# Patient Record
Sex: Male | Born: 1955 | Race: White | Hispanic: No | Marital: Single | Smoking: Former smoker
Health system: Southern US, Community
[De-identification: ages and names within clinical notes are randomized; demographics above are authoritative.]

## PROBLEM LIST (undated history)

## (undated) DIAGNOSIS — E7800 Pure hypercholesterolemia, unspecified: Secondary | ICD-10-CM

## (undated) DIAGNOSIS — E559 Vitamin D deficiency, unspecified: Secondary | ICD-10-CM

## (undated) DIAGNOSIS — D649 Anemia, unspecified: Secondary | ICD-10-CM

## (undated) DIAGNOSIS — F329 Major depressive disorder, single episode, unspecified: Secondary | ICD-10-CM

## (undated) DIAGNOSIS — I10 Essential (primary) hypertension: Secondary | ICD-10-CM

## (undated) DIAGNOSIS — F0390 Unspecified dementia without behavioral disturbance: Secondary | ICD-10-CM

## (undated) DIAGNOSIS — IMO0002 Reserved for concepts with insufficient information to code with codable children: Secondary | ICD-10-CM

## (undated) DIAGNOSIS — K219 Gastro-esophageal reflux disease without esophagitis: Secondary | ICD-10-CM

## (undated) DIAGNOSIS — N189 Chronic kidney disease, unspecified: Secondary | ICD-10-CM

## (undated) DIAGNOSIS — F29 Unspecified psychosis not due to a substance or known physiological condition: Secondary | ICD-10-CM

## (undated) DIAGNOSIS — I2510 Atherosclerotic heart disease of native coronary artery without angina pectoris: Secondary | ICD-10-CM

## (undated) DIAGNOSIS — M1990 Unspecified osteoarthritis, unspecified site: Secondary | ICD-10-CM

## (undated) DIAGNOSIS — Z8739 Personal history of other diseases of the musculoskeletal system and connective tissue: Secondary | ICD-10-CM

## (undated) HISTORY — PX: OTHER SURGICAL HISTORY: SHX169

---

## 2001-11-22 HISTORY — PX: EYE SURGERY: SHX253

## 2010-10-26 ENCOUNTER — Emergency Department (HOSPITAL_COMMUNITY)
Admission: EM | Admit: 2010-10-26 | Discharge: 2010-10-26 | Payer: Self-pay | Source: Home / Self Care | Admitting: Emergency Medicine

## 2010-11-20 ENCOUNTER — Emergency Department (HOSPITAL_COMMUNITY)
Admission: EM | Admit: 2010-11-20 | Discharge: 2010-11-20 | Payer: Self-pay | Source: Home / Self Care | Admitting: Emergency Medicine

## 2010-12-19 ENCOUNTER — Emergency Department (HOSPITAL_COMMUNITY)
Admission: EM | Admit: 2010-12-19 | Discharge: 2010-12-21 | Disposition: A | Discharge status: Other Acute Inpatient Hospital | Payer: Self-pay | Source: Home / Self Care | Admitting: Emergency Medicine

## 2010-12-19 LAB — RAPID URINE DRUG SCREEN, HOSP PERFORMED
Barbiturates: NOT DETECTED
Opiates: POSITIVE — AB
Tetrahydrocannabinol: NOT DETECTED

## 2010-12-19 LAB — CBC
HCT: 42.3 % (ref 39.0–52.0)
Hemoglobin: 13.7 g/dL (ref 13.0–17.0)
MCHC: 32.4 g/dL (ref 30.0–36.0)
MCV: 91.8 fL (ref 78.0–100.0)
Platelets: 172 10*3/uL (ref 150–400)
WBC: 10.8 10*3/uL — ABNORMAL HIGH (ref 4.0–10.5)

## 2010-12-19 LAB — CK TOTAL AND CKMB (NOT AT ARMC)
CK, MB: 1.2 ng/mL (ref 0.3–4.0)
Total CK: 65 U/L (ref 7–232)

## 2010-12-19 LAB — DIFFERENTIAL
Basophils Absolute: 0 10*3/uL (ref 0.0–0.1)
Lymphs Abs: 1.4 10*3/uL (ref 0.7–4.0)
Monocytes Relative: 7 % (ref 3–12)

## 2010-12-19 LAB — COMPREHENSIVE METABOLIC PANEL
ALT: 14 U/L (ref 0–53)
Albumin: 3.9 g/dL (ref 3.5–5.2)
Alkaline Phosphatase: 61 U/L (ref 39–117)
CO2: 25 mEq/L (ref 19–32)
Chloride: 106 mEq/L (ref 96–112)
GFR calc non Af Amer: >60 mL/min (ref >60)
Sodium: 142 mEq/L (ref 135–145)
Total Protein: 7.1 g/dL (ref 6.0–8.3)

## 2010-12-20 DIAGNOSIS — F29 Unspecified psychosis not due to a substance or known physiological condition: Secondary | ICD-10-CM

## 2010-12-20 LAB — GLUCOSE, CAPILLARY
Glucose-Capillary: 109 mg/dL — ABNORMAL HIGH (ref 70–99)
Glucose-Capillary: 67 mg/dL — ABNORMAL LOW (ref 70–99)

## 2010-12-21 ENCOUNTER — Inpatient Hospital Stay (HOSPITAL_COMMUNITY)
Admission: AD | Admit: 2010-12-21 | Discharge: 2010-12-29 | DRG: 885 | Disposition: A | Discharge status: Home / Self Care | Payer: Medicaid Other | Attending: Psychiatry | Admitting: Psychiatry

## 2010-12-21 DIAGNOSIS — I10 Essential (primary) hypertension: Secondary | ICD-10-CM

## 2010-12-21 DIAGNOSIS — E785 Hyperlipidemia, unspecified: Secondary | ICD-10-CM

## 2010-12-21 DIAGNOSIS — K219 Gastro-esophageal reflux disease without esophagitis: Secondary | ICD-10-CM

## 2010-12-21 DIAGNOSIS — F411 Generalized anxiety disorder: Secondary | ICD-10-CM

## 2010-12-21 DIAGNOSIS — D649 Anemia, unspecified: Secondary | ICD-10-CM

## 2010-12-21 DIAGNOSIS — F29 Unspecified psychosis not due to a substance or known physiological condition: Secondary | ICD-10-CM

## 2010-12-21 DIAGNOSIS — Z7982 Long term (current) use of aspirin: Secondary | ICD-10-CM

## 2010-12-21 DIAGNOSIS — E119 Type 2 diabetes mellitus without complications: Secondary | ICD-10-CM

## 2010-12-21 DIAGNOSIS — I2510 Atherosclerotic heart disease of native coronary artery without angina pectoris: Secondary | ICD-10-CM

## 2010-12-21 DIAGNOSIS — E669 Obesity, unspecified: Secondary | ICD-10-CM

## 2010-12-21 DIAGNOSIS — F39 Unspecified mood [affective] disorder: Principal | ICD-10-CM

## 2010-12-21 DIAGNOSIS — R45851 Suicidal ideations: Secondary | ICD-10-CM

## 2010-12-21 LAB — GLUCOSE, CAPILLARY
Glucose-Capillary: 104 mg/dL — ABNORMAL HIGH (ref 70–99)
Glucose-Capillary: 118 mg/dL — ABNORMAL HIGH (ref 70–99)
Glucose-Capillary: 67 mg/dL — ABNORMAL LOW (ref 70–99)
Glucose-Capillary: 82 mg/dL (ref 70–99)

## 2010-12-21 LAB — CK TOTAL AND CKMB (NOT AT ARMC)
CK, MB: 0.6 ng/mL (ref 0.3–4.0)
Relative Index: INVALID (ref 0.0–2.5)

## 2010-12-22 LAB — GLUCOSE, CAPILLARY: Glucose-Capillary: 79 mg/dL (ref 70–99)

## 2010-12-23 DIAGNOSIS — F333 Major depressive disorder, recurrent, severe with psychotic symptoms: Secondary | ICD-10-CM

## 2010-12-23 LAB — GLUCOSE, CAPILLARY
Glucose-Capillary: 93 mg/dL (ref 70–99)
Glucose-Capillary: 100 mg/dL — ABNORMAL HIGH (ref 70–99)

## 2010-12-24 LAB — GLUCOSE, CAPILLARY

## 2010-12-25 LAB — GLUCOSE, CAPILLARY: Glucose-Capillary: 128 mg/dL — ABNORMAL HIGH (ref 70–99)

## 2010-12-26 LAB — GLUCOSE, CAPILLARY: Glucose-Capillary: 84 mg/dL (ref 70–99)

## 2010-12-27 LAB — GLUCOSE, CAPILLARY

## 2010-12-28 LAB — GLUCOSE, CAPILLARY
Glucose-Capillary: 106 mg/dL — ABNORMAL HIGH (ref 70–99)
Glucose-Capillary: 154 mg/dL — ABNORMAL HIGH (ref 70–99)
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
## 2011-01-15 ENCOUNTER — Emergency Department (HOSPITAL_COMMUNITY)
Admission: EM | Admit: 2011-01-15 | Discharge: 2011-01-15 | Disposition: A | Discharge status: Home / Self Care | Payer: Medicaid Other | Attending: Emergency Medicine | Admitting: Emergency Medicine

## 2011-01-15 DIAGNOSIS — I2510 Atherosclerotic heart disease of native coronary artery without angina pectoris: Secondary | ICD-10-CM | POA: Insufficient documentation

## 2011-01-15 DIAGNOSIS — R220 Localized swelling, mass and lump, head: Secondary | ICD-10-CM | POA: Insufficient documentation

## 2011-01-15 DIAGNOSIS — L0202 Furuncle of face: Secondary | ICD-10-CM | POA: Insufficient documentation

## 2011-01-15 DIAGNOSIS — L0201 Cutaneous abscess of face: Secondary | ICD-10-CM | POA: Insufficient documentation

## 2011-01-15 DIAGNOSIS — E7800 Pure hypercholesterolemia, unspecified: Secondary | ICD-10-CM | POA: Insufficient documentation

## 2011-01-15 DIAGNOSIS — F329 Major depressive disorder, single episode, unspecified: Secondary | ICD-10-CM | POA: Insufficient documentation

## 2011-01-15 DIAGNOSIS — R51 Headache: Secondary | ICD-10-CM | POA: Insufficient documentation

## 2011-01-15 DIAGNOSIS — Z79899 Other long term (current) drug therapy: Secondary | ICD-10-CM | POA: Insufficient documentation

## 2011-01-15 DIAGNOSIS — E119 Type 2 diabetes mellitus without complications: Secondary | ICD-10-CM | POA: Insufficient documentation

## 2011-01-15 DIAGNOSIS — F3289 Other specified depressive episodes: Secondary | ICD-10-CM | POA: Insufficient documentation

## 2011-01-15 DIAGNOSIS — I10 Essential (primary) hypertension: Secondary | ICD-10-CM | POA: Insufficient documentation

## 2011-01-15 DIAGNOSIS — L03211 Cellulitis of face: Secondary | ICD-10-CM | POA: Insufficient documentation

## 2011-01-20 ENCOUNTER — Emergency Department (HOSPITAL_COMMUNITY): Payer: Medicaid Other

## 2011-01-20 ENCOUNTER — Emergency Department (HOSPITAL_COMMUNITY)
Admission: EM | Admit: 2011-01-20 | Discharge: 2011-01-21 | Disposition: A | Discharge status: Home / Self Care | Payer: Medicaid Other | Attending: Emergency Medicine | Admitting: Emergency Medicine

## 2011-01-20 DIAGNOSIS — IMO0002 Reserved for concepts with insufficient information to code with codable children: Secondary | ICD-10-CM | POA: Insufficient documentation

## 2011-01-20 DIAGNOSIS — E7800 Pure hypercholesterolemia, unspecified: Secondary | ICD-10-CM | POA: Insufficient documentation

## 2011-01-20 DIAGNOSIS — F329 Major depressive disorder, single episode, unspecified: Secondary | ICD-10-CM | POA: Insufficient documentation

## 2011-01-20 DIAGNOSIS — I2510 Atherosclerotic heart disease of native coronary artery without angina pectoris: Secondary | ICD-10-CM | POA: Insufficient documentation

## 2011-01-20 DIAGNOSIS — M542 Cervicalgia: Secondary | ICD-10-CM | POA: Insufficient documentation

## 2011-01-20 DIAGNOSIS — I10 Essential (primary) hypertension: Secondary | ICD-10-CM | POA: Insufficient documentation

## 2011-01-20 DIAGNOSIS — F3289 Other specified depressive episodes: Secondary | ICD-10-CM | POA: Insufficient documentation

## 2011-01-20 DIAGNOSIS — E119 Type 2 diabetes mellitus without complications: Secondary | ICD-10-CM | POA: Insufficient documentation

## 2011-01-20 DIAGNOSIS — Z79899 Other long term (current) drug therapy: Secondary | ICD-10-CM | POA: Insufficient documentation

## 2011-01-20 DIAGNOSIS — Z7982 Long term (current) use of aspirin: Secondary | ICD-10-CM | POA: Insufficient documentation

## 2011-01-20 DIAGNOSIS — S060X9A Concussion with loss of consciousness of unspecified duration, initial encounter: Secondary | ICD-10-CM | POA: Insufficient documentation

## 2011-01-20 DIAGNOSIS — W108XXA Fall (on) (from) other stairs and steps, initial encounter: Secondary | ICD-10-CM | POA: Insufficient documentation

## 2011-01-20 DIAGNOSIS — F17200 Nicotine dependence, unspecified, uncomplicated: Secondary | ICD-10-CM | POA: Insufficient documentation

## 2011-01-20 DIAGNOSIS — S335XXA Sprain of ligaments of lumbar spine, initial encounter: Secondary | ICD-10-CM | POA: Insufficient documentation

## 2011-01-20 DIAGNOSIS — R55 Syncope and collapse: Secondary | ICD-10-CM | POA: Insufficient documentation

## 2011-01-20 LAB — POCT CARDIAC MARKERS
CKMB, poc: <1 ng/mL — ABNORMAL LOW (ref 1.0–8.0)
Myoglobin, poc: 56.9 ng/mL (ref 12–200)
Troponin i, poc: <0.05 ng/mL (ref 0.00–0.09)

## 2011-01-20 LAB — DIFFERENTIAL
Eosinophils Relative: 1 % (ref 0–5)
Lymphocytes Relative: 19 % (ref 12–46)
Lymphs Abs: 2.2 10*3/uL (ref 0.7–4.0)
Monocytes Absolute: 0.9 10*3/uL (ref 0.1–1.0)
Monocytes Relative: 8 % (ref 3–12)

## 2011-01-20 LAB — CBC
HCT: 39.2 % (ref 39.0–52.0)
Hemoglobin: 12.7 g/dL — ABNORMAL LOW (ref 13.0–17.0)
MCHC: 32.4 g/dL (ref 30.0–36.0)
MCV: 92.5 fL (ref 78.0–100.0)
RDW: 13.4 % (ref 11.5–15.5)

## 2011-01-20 LAB — BASIC METABOLIC PANEL
BUN: 16 mg/dL (ref 6–23)
Calcium: 8.9 mg/dL (ref 8.4–10.5)
GFR calc non Af Amer: >60 mL/min (ref >60)
Potassium: 3.7 mEq/L (ref 3.5–5.1)
Sodium: 139 mEq/L (ref 135–145)

## 2011-02-01 LAB — DIFFERENTIAL
Eosinophils Relative: 1 % (ref 0–5)
Lymphocytes Relative: 20 % (ref 12–46)
Lymphs Abs: 1.5 10*3/uL (ref 0.7–4.0)
Monocytes Absolute: 0.6 10*3/uL (ref 0.1–1.0)
Monocytes Relative: 8 % (ref 3–12)

## 2011-02-01 LAB — POCT I-STAT, CHEM 8
BUN: 12 mg/dL (ref 6–23)
Calcium, Ion: 1.07 mmol/L — ABNORMAL LOW (ref 1.12–1.32)
Creatinine, Ser: 0.8 mg/dL (ref 0.4–1.5)
Glucose, Bld: 106 mg/dL — ABNORMAL HIGH (ref 70–99)
TCO2: 26 mmol/L (ref 0–100)

## 2011-02-01 LAB — CBC
HCT: 39 % (ref 39.0–52.0)
MCV: 93.5 fL (ref 78.0–100.0)
RBC: 4.17 MIL/uL — ABNORMAL LOW (ref 4.22–5.81)
WBC: 7.4 10*3/uL (ref 4.0–10.5)

## 2011-02-02 LAB — CBC
Hemoglobin: 14.1 g/dL (ref 13.0–17.0)
MCH: 31.2 pg (ref 26.0–34.0)
MCV: 92.3 fL (ref 78.0–100.0)
RBC: 4.52 MIL/uL (ref 4.22–5.81)

## 2011-02-02 LAB — GLUCOSE, CAPILLARY

## 2011-02-02 LAB — DIFFERENTIAL
Eosinophils Absolute: 0.1 10*3/uL (ref 0.0–0.7)
Lymphs Abs: 2.2 10*3/uL (ref 0.7–4.0)
Monocytes Relative: 7 % (ref 3–12)
Neutrophils Relative %: 74 % (ref 43–77)

## 2011-02-08 ENCOUNTER — Emergency Department (HOSPITAL_COMMUNITY)
Admission: EM | Admit: 2011-02-08 | Discharge: 2011-02-09 | Disposition: A | Discharge status: Other Acute Inpatient Hospital | Payer: Medicaid Other | Source: Home / Self Care | Attending: Emergency Medicine | Admitting: Emergency Medicine

## 2011-02-08 DIAGNOSIS — R45850 Homicidal ideations: Secondary | ICD-10-CM | POA: Insufficient documentation

## 2011-02-08 DIAGNOSIS — R45851 Suicidal ideations: Secondary | ICD-10-CM | POA: Insufficient documentation

## 2011-02-08 DIAGNOSIS — I10 Essential (primary) hypertension: Secondary | ICD-10-CM | POA: Insufficient documentation

## 2011-02-08 DIAGNOSIS — E119 Type 2 diabetes mellitus without complications: Secondary | ICD-10-CM | POA: Insufficient documentation

## 2011-02-08 DIAGNOSIS — F411 Generalized anxiety disorder: Secondary | ICD-10-CM | POA: Insufficient documentation

## 2011-02-08 LAB — CBC
HCT: 42.3 % (ref 39.0–52.0)
Platelets: 178 10*3/uL (ref 150–400)
RDW: 13.8 % (ref 11.5–15.5)
WBC: 9.9 10*3/uL (ref 4.0–10.5)

## 2011-02-08 LAB — BASIC METABOLIC PANEL
Calcium: 9.3 mg/dL (ref 8.4–10.5)
Creatinine, Ser: 0.94 mg/dL (ref 0.4–1.5)
GFR calc Af Amer: >60 mL/min (ref >60)

## 2011-02-08 LAB — ETHANOL: Alcohol, Ethyl (B): <5 mg/dL (ref 0–10)

## 2011-02-08 LAB — RAPID URINE DRUG SCREEN, HOSP PERFORMED
Amphetamines: NOT DETECTED
Opiates: NOT DETECTED
Tetrahydrocannabinol: NOT DETECTED

## 2011-02-08 LAB — DIFFERENTIAL
Basophils Absolute: 0 10*3/uL (ref 0.0–0.1)
Basophils Relative: 0 % (ref 0–1)
Eosinophils Absolute: 0.1 10*3/uL (ref 0.0–0.7)
Eosinophils Relative: 1 % (ref 0–5)
Lymphocytes Relative: 25 % (ref 12–46)

## 2011-02-08 LAB — GLUCOSE, CAPILLARY: Glucose-Capillary: 89 mg/dL (ref 70–99)

## 2011-02-09 ENCOUNTER — Inpatient Hospital Stay (HOSPITAL_COMMUNITY)
Admission: AD | Admit: 2011-02-09 | Discharge: 2011-02-13 | DRG: 885 | Disposition: A | Discharge status: Home / Self Care | Payer: Medicaid Other | Source: Ambulatory Visit | Attending: Psychiatry | Admitting: Psychiatry

## 2011-02-09 DIAGNOSIS — R45850 Homicidal ideations: Secondary | ICD-10-CM

## 2011-02-09 DIAGNOSIS — Z560 Unemployment, unspecified: Secondary | ICD-10-CM

## 2011-02-09 DIAGNOSIS — I10 Essential (primary) hypertension: Secondary | ICD-10-CM

## 2011-02-09 DIAGNOSIS — F4310 Post-traumatic stress disorder, unspecified: Secondary | ICD-10-CM

## 2011-02-09 DIAGNOSIS — F332 Major depressive disorder, recurrent severe without psychotic features: Principal | ICD-10-CM

## 2011-02-09 DIAGNOSIS — IMO0002 Reserved for concepts with insufficient information to code with codable children: Secondary | ICD-10-CM

## 2011-02-09 DIAGNOSIS — M1990 Unspecified osteoarthritis, unspecified site: Secondary | ICD-10-CM

## 2011-02-09 DIAGNOSIS — Z9849 Cataract extraction status, unspecified eye: Secondary | ICD-10-CM

## 2011-02-09 DIAGNOSIS — Z794 Long term (current) use of insulin: Secondary | ICD-10-CM

## 2011-02-09 DIAGNOSIS — R45851 Suicidal ideations: Secondary | ICD-10-CM

## 2011-02-09 DIAGNOSIS — E7800 Pure hypercholesterolemia, unspecified: Secondary | ICD-10-CM

## 2011-02-09 DIAGNOSIS — E119 Type 2 diabetes mellitus without complications: Secondary | ICD-10-CM

## 2011-02-09 DIAGNOSIS — K219 Gastro-esophageal reflux disease without esophagitis: Secondary | ICD-10-CM

## 2011-02-09 LAB — GLUCOSE, CAPILLARY: Glucose-Capillary: 105 mg/dL — ABNORMAL HIGH (ref 70–99)

## 2011-02-10 DIAGNOSIS — F4310 Post-traumatic stress disorder, unspecified: Secondary | ICD-10-CM

## 2011-02-10 DIAGNOSIS — F332 Major depressive disorder, recurrent severe without psychotic features: Secondary | ICD-10-CM

## 2011-02-10 LAB — GLUCOSE, CAPILLARY
Glucose-Capillary: 103 mg/dL — ABNORMAL HIGH (ref 70–99)
Glucose-Capillary: 125 mg/dL — ABNORMAL HIGH (ref 70–99)

## 2011-02-11 LAB — GLUCOSE, CAPILLARY
Glucose-Capillary: 114 mg/dL — ABNORMAL HIGH (ref 70–99)
Glucose-Capillary: 148 mg/dL — ABNORMAL HIGH (ref 70–99)
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
## 2011-02-12 LAB — VITAMIN B12: Vitamin B-12: 351 pg/mL (ref 211–911)

## 2011-02-12 LAB — GLUCOSE, CAPILLARY
Glucose-Capillary: 90 mg/dL (ref 70–99)
Glucose-Capillary: 71 mg/dL (ref 70–99)

## 2011-02-12 LAB — FOLATE: Folate: 12 ng/mL

## 2011-02-13 LAB — GLUCOSE, CAPILLARY: Glucose-Capillary: 77 mg/dL (ref 70–99)
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
## 2011-03-25 ENCOUNTER — Emergency Department (HOSPITAL_COMMUNITY)
Admission: EM | Admit: 2011-03-25 | Discharge: 2011-03-26 | Disposition: A | Discharge status: Other Acute Inpatient Hospital | Payer: Medicaid Other | Source: Home / Self Care | Attending: Emergency Medicine | Admitting: Emergency Medicine

## 2011-03-25 DIAGNOSIS — F329 Major depressive disorder, single episode, unspecified: Secondary | ICD-10-CM | POA: Insufficient documentation

## 2011-03-25 DIAGNOSIS — E669 Obesity, unspecified: Secondary | ICD-10-CM | POA: Insufficient documentation

## 2011-03-25 DIAGNOSIS — I2510 Atherosclerotic heart disease of native coronary artery without angina pectoris: Secondary | ICD-10-CM | POA: Insufficient documentation

## 2011-03-25 DIAGNOSIS — E7800 Pure hypercholesterolemia, unspecified: Secondary | ICD-10-CM | POA: Insufficient documentation

## 2011-03-25 DIAGNOSIS — F411 Generalized anxiety disorder: Secondary | ICD-10-CM | POA: Insufficient documentation

## 2011-03-25 DIAGNOSIS — I10 Essential (primary) hypertension: Secondary | ICD-10-CM | POA: Insufficient documentation

## 2011-03-25 DIAGNOSIS — F22 Delusional disorders: Secondary | ICD-10-CM | POA: Insufficient documentation

## 2011-03-25 DIAGNOSIS — F3289 Other specified depressive episodes: Secondary | ICD-10-CM | POA: Insufficient documentation

## 2011-03-25 DIAGNOSIS — E119 Type 2 diabetes mellitus without complications: Secondary | ICD-10-CM | POA: Insufficient documentation

## 2011-03-25 LAB — DIFFERENTIAL
Lymphocytes Relative: 30 % (ref 12–46)
Lymphs Abs: 3.3 10*3/uL (ref 0.7–4.0)
Monocytes Absolute: 0.6 10*3/uL (ref 0.1–1.0)
Monocytes Relative: 6 % (ref 3–12)
Neutro Abs: 6.8 10*3/uL (ref 1.7–7.7)

## 2011-03-25 LAB — BASIC METABOLIC PANEL
BUN: 26 mg/dL — ABNORMAL HIGH (ref 6–23)
CO2: 24 mEq/L (ref 19–32)
Chloride: 106 mEq/L (ref 96–112)
Creatinine, Ser: 0.98 mg/dL (ref 0.4–1.5)
Potassium: 4.4 mEq/L (ref 3.5–5.1)

## 2011-03-25 LAB — CBC
HCT: 43.7 % (ref 39.0–52.0)
Hemoglobin: 13.8 g/dL (ref 13.0–17.0)
MCH: 29.6 pg (ref 26.0–34.0)
MCHC: 31.6 g/dL (ref 30.0–36.0)
MCV: 93.6 fL (ref 78.0–100.0)
RBC: 4.67 MIL/uL (ref 4.22–5.81)

## 2011-03-26 ENCOUNTER — Inpatient Hospital Stay (HOSPITAL_COMMUNITY)
Admission: AD | Admit: 2011-03-26 | Discharge: 2011-04-07 | DRG: 885 | Disposition: A | Discharge status: Home / Self Care | Payer: Medicaid Other | Source: Ambulatory Visit | Attending: Psychiatry | Admitting: Psychiatry

## 2011-03-26 DIAGNOSIS — E119 Type 2 diabetes mellitus without complications: Secondary | ICD-10-CM | POA: Diagnosis present

## 2011-03-26 DIAGNOSIS — F4310 Post-traumatic stress disorder, unspecified: Secondary | ICD-10-CM | POA: Diagnosis present

## 2011-03-26 DIAGNOSIS — B372 Candidiasis of skin and nail: Secondary | ICD-10-CM | POA: Diagnosis present

## 2011-03-26 DIAGNOSIS — F333 Major depressive disorder, recurrent, severe with psychotic symptoms: Principal | ICD-10-CM | POA: Diagnosis present

## 2011-03-26 DIAGNOSIS — E7800 Pure hypercholesterolemia, unspecified: Secondary | ICD-10-CM | POA: Diagnosis present

## 2011-03-26 DIAGNOSIS — Z794 Long term (current) use of insulin: Secondary | ICD-10-CM

## 2011-03-26 DIAGNOSIS — I10 Essential (primary) hypertension: Secondary | ICD-10-CM | POA: Diagnosis present

## 2011-03-26 DIAGNOSIS — F29 Unspecified psychosis not due to a substance or known physiological condition: Secondary | ICD-10-CM

## 2011-03-26 DIAGNOSIS — M1990 Unspecified osteoarthritis, unspecified site: Secondary | ICD-10-CM | POA: Diagnosis present

## 2011-03-26 DIAGNOSIS — K219 Gastro-esophageal reflux disease without esophagitis: Secondary | ICD-10-CM | POA: Diagnosis present

## 2011-03-26 LAB — RAPID URINE DRUG SCREEN, HOSP PERFORMED
Amphetamines: NOT DETECTED
Benzodiazepines: POSITIVE — AB

## 2011-03-26 LAB — GLUCOSE, CAPILLARY: Glucose-Capillary: 84 mg/dL (ref 70–99)

## 2011-03-27 DIAGNOSIS — F323 Major depressive disorder, single episode, severe with psychotic features: Secondary | ICD-10-CM

## 2011-03-27 LAB — GLUCOSE, CAPILLARY: Glucose-Capillary: 93 mg/dL (ref 70–99)

## 2011-03-28 LAB — GLUCOSE, CAPILLARY

## 2011-03-29 LAB — GLUCOSE, CAPILLARY: Glucose-Capillary: 93 mg/dL (ref 70–99)

## 2011-03-30 LAB — GLUCOSE, CAPILLARY
Glucose-Capillary: 86 mg/dL (ref 70–99)
Glucose-Capillary: 112 mg/dL — ABNORMAL HIGH (ref 70–99)
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
## 2011-04-01 LAB — GLUCOSE, CAPILLARY: Glucose-Capillary: 102 mg/dL — ABNORMAL HIGH (ref 70–99)

## 2011-04-02 LAB — GLUCOSE, CAPILLARY
Glucose-Capillary: 74 mg/dL (ref 70–99)
Glucose-Capillary: 80 mg/dL (ref 70–99)

## 2011-04-03 LAB — GLUCOSE, CAPILLARY: Glucose-Capillary: 76 mg/dL (ref 70–99)

## 2011-04-04 LAB — GLUCOSE, CAPILLARY
Glucose-Capillary: 124 mg/dL — ABNORMAL HIGH (ref 70–99)
Glucose-Capillary: 121 mg/dL — ABNORMAL HIGH (ref 70–99)
Glucose-Capillary: 110 mg/dL — ABNORMAL HIGH (ref 70–99)
Glucose-Capillary: 130 mg/dL — ABNORMAL HIGH (ref 70–99)

## 2011-04-05 LAB — GLUCOSE, CAPILLARY: Glucose-Capillary: 74 mg/dL (ref 70–99)

## 2011-04-06 LAB — GLUCOSE, CAPILLARY
Glucose-Capillary: 121 mg/dL — ABNORMAL HIGH (ref 70–99)
Glucose-Capillary: 152 mg/dL — ABNORMAL HIGH (ref 70–99)

## 2011-04-07 LAB — GLUCOSE, CAPILLARY: Glucose-Capillary: 85 mg/dL (ref 70–99)
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
## 2012-03-18 ENCOUNTER — Emergency Department (HOSPITAL_COMMUNITY)
Admission: EM | Admit: 2012-03-18 | Discharge: 2012-03-18 | Disposition: A | Discharge status: Home / Self Care | Payer: Medicaid Other | Attending: Emergency Medicine | Admitting: Emergency Medicine

## 2012-03-18 ENCOUNTER — Emergency Department (HOSPITAL_COMMUNITY): Payer: Medicaid Other

## 2012-03-18 ENCOUNTER — Encounter (HOSPITAL_COMMUNITY): Payer: Self-pay

## 2012-03-18 DIAGNOSIS — E162 Hypoglycemia, unspecified: Secondary | ICD-10-CM

## 2012-03-18 DIAGNOSIS — E1169 Type 2 diabetes mellitus with other specified complication: Secondary | ICD-10-CM | POA: Insufficient documentation

## 2012-03-18 DIAGNOSIS — K219 Gastro-esophageal reflux disease without esophagitis: Secondary | ICD-10-CM | POA: Insufficient documentation

## 2012-03-18 DIAGNOSIS — F329 Major depressive disorder, single episode, unspecified: Secondary | ICD-10-CM | POA: Insufficient documentation

## 2012-03-18 DIAGNOSIS — R109 Unspecified abdominal pain: Secondary | ICD-10-CM | POA: Insufficient documentation

## 2012-03-18 DIAGNOSIS — I10 Essential (primary) hypertension: Secondary | ICD-10-CM | POA: Insufficient documentation

## 2012-03-18 DIAGNOSIS — K8020 Calculus of gallbladder without cholecystitis without obstruction: Secondary | ICD-10-CM | POA: Insufficient documentation

## 2012-03-18 HISTORY — DX: Pure hypercholesterolemia, unspecified: E78.00

## 2012-03-18 HISTORY — DX: Reserved for concepts with insufficient information to code with codable children: IMO0002

## 2012-03-18 HISTORY — DX: Unspecified psychosis not due to a substance or known physiological condition: F29

## 2012-03-18 HISTORY — DX: Gastro-esophageal reflux disease without esophagitis: K21.9

## 2012-03-18 HISTORY — DX: Essential (primary) hypertension: I10

## 2012-03-18 HISTORY — DX: Major depressive disorder, single episode, unspecified: F32.9

## 2012-03-18 LAB — URINALYSIS, ROUTINE W REFLEX MICROSCOPIC
Glucose, UA: NEGATIVE mg/dL
Hgb urine dipstick: NEGATIVE
Specific Gravity, Urine: 1.026 (ref 1.005–1.030)
Urobilinogen, UA: 1 mg/dL (ref 0.0–1.0)
pH: 6 (ref 5.0–8.0)

## 2012-03-18 LAB — COMPREHENSIVE METABOLIC PANEL
ALT: 22 U/L (ref 0–53)
AST: 20 U/L (ref 0–37)
Alkaline Phosphatase: 59 U/L (ref 39–117)
Calcium: 9.5 mg/dL (ref 8.4–10.5)
GFR calc Af Amer: 73 mL/min — ABNORMAL LOW (ref >90)
Glucose, Bld: 48 mg/dL — ABNORMAL LOW (ref 70–99)
Potassium: 4.5 mEq/L (ref 3.5–5.1)
Sodium: 140 mEq/L (ref 135–145)
Total Protein: 6.8 g/dL (ref 6.0–8.3)

## 2012-03-18 LAB — URINE MICROSCOPIC-ADD ON

## 2012-03-18 LAB — CBC
MCH: 30.1 pg (ref 26.0–34.0)
Platelets: 132 10*3/uL — ABNORMAL LOW (ref 150–400)
RBC: 4.75 MIL/uL (ref 4.22–5.81)
RDW: 14 % (ref 11.5–15.5)
WBC: 6.9 10*3/uL (ref 4.0–10.5)

## 2012-03-18 LAB — GLUCOSE, CAPILLARY: Glucose-Capillary: 72 mg/dL (ref 70–99)

## 2012-03-18 LAB — CARDIAC PANEL(CRET KIN+CKTOT+MB+TROPI)
CK, MB: 1.3 ng/mL (ref 0.3–4.0)
Relative Index: INVALID (ref 0.0–2.5)
Total CK: 38 U/L (ref 7–232)
Troponin I: <0.3 ng/mL (ref <0.30)

## 2012-03-18 LAB — DIFFERENTIAL
Basophils Absolute: 0 10*3/uL (ref 0.0–0.1)
Eosinophils Absolute: 0.1 10*3/uL (ref 0.0–0.7)
Lymphocytes Relative: 24 % (ref 12–46)
Lymphs Abs: 1.7 10*3/uL (ref 0.7–4.0)
Neutrophils Relative %: 67 % (ref 43–77)

## 2012-03-18 MED ORDER — MORPHINE SULFATE 4 MG/ML IJ SOLN
4.0000 mg | Freq: Once | INTRAMUSCULAR | Status: AC
  Administered 2012-03-18: 4 mg via INTRAVENOUS
  Filled 2012-03-18: qty 1

## 2012-03-18 MED ORDER — IOHEXOL 300 MG/ML  SOLN
100.0000 mL | Freq: Once | INTRAMUSCULAR | Status: AC | PRN
  Administered 2012-03-18: 100 mL via INTRAVENOUS

## 2012-03-18 MED ORDER — SODIUM CHLORIDE 0.9 % IV SOLN
1000.0000 mL | INTRAVENOUS | Status: DC
  Administered 2012-03-18: 1000 mL via INTRAVENOUS

## 2012-03-18 MED ORDER — ONDANSETRON HCL 4 MG/2ML IJ SOLN
4.0000 mg | Freq: Once | INTRAMUSCULAR | Status: AC
  Administered 2012-03-18: 4 mg via INTRAVENOUS
  Filled 2012-03-18: qty 2

## 2012-03-18 MED ORDER — DEXTROSE 50 % IV SOLN
25.0000 g | Freq: Once | INTRAVENOUS | Status: AC
  Administered 2012-03-18: 25 g via INTRAVENOUS
  Filled 2012-03-18: qty 50

## 2012-03-18 MED ORDER — IOHEXOL 300 MG/ML  SOLN
20.0000 mL | INTRAMUSCULAR | Status: AC
  Administered 2012-03-18: 20 mL via ORAL

## 2012-03-18 NOTE — ED Provider Notes (Signed)
History     CSN: [id number]  Arrival date & time 03/18/12  1059   First MD Initiated Contact with Patient 03/18/12 1105      Chief Complaint  Patient presents with  . Abdominal Pain    (Consider location/radiation/quality/duration/timing/severity/associated sxs/prior treatment) HPI Comments: The patient is a 56 year old man who lives in a group home. He says that he developed pain in his midabdomen last night. There was no nausea or vomiting. He does not think he hit anything that made him sick. He therefore sought evaluation. He also notes some pain in his left knee. And he has some pain in the chest. Review of her records shows a history of psychiatric hospitalization for anxiety and depression. He doesn't know what medicines he takes.  Patient is a 56 y.o. male presenting with abdominal pain. The history is provided by the patient and medical records. No language interpreter was used.  Abdominal Pain The primary symptoms of the illness include abdominal pain. The primary symptoms of the illness do not include fever, nausea, vomiting or diarrhea. The current episode started 3 to 5 hours ago. The onset of the illness was gradual. The problem has not changed since onset. Associated with: Nothing. The patient has not had a change in bowel habit. Additional symptoms associated with the illness include anorexia. Symptoms associated with the illness do not include chills, hematuria, frequency or back pain.    Past Medical History  Diagnosis Date  . Hypertension   . Diabetes mellitus   . Hypercholesterolemia   . GERD (gastroesophageal reflux disease)   . Psychosis   . Degenerative disc disease   . Degenerative disc disease   . Major depressive disorder     History reviewed. No pertinent past surgical history.  History reviewed. No pertinent family history.  History  Substance Use Topics  . Smoking status: Never Smoker   . Smokeless tobacco: Not on file  . Alcohol Use: No       Review of Systems  Constitutional: Negative.  Negative for fever and chills.  HENT: Negative.   Eyes: Negative.   Respiratory: Negative.   Cardiovascular: Positive for chest pain.  Gastrointestinal: Positive for abdominal pain and anorexia. Negative for nausea, vomiting and diarrhea.  Genitourinary: Negative.  Negative for frequency and hematuria.  Musculoskeletal: Negative.  Negative for back pain.  Skin: Negative.   Neurological: Negative.   Psychiatric/Behavioral: Negative.     Allergies  Review of patient's allergies indicates no known allergies.  Home Medications  No current outpatient prescriptions on file.  BP 100/61  Pulse 66  Temp(Src) 98.1 F (36.7 C) (Oral)  Resp 18  SpO2 96%  Physical Exam  Nursing note and vitals reviewed. Constitutional: He is oriented to person, place, and time.       Morbidly obese white male in no distress at rest. He localizes pain transversely across his midabdomen.  HENT:  Head: Normocephalic and atraumatic.  Right Ear: External ear normal.  Left Ear: External ear normal.  Mouth/Throat: Oropharynx is clear and moist.  Eyes: Conjunctivae are normal. Pupils are equal, round, and reactive to light. No scleral icterus.  Neck: Normal range of motion. Neck supple.  Cardiovascular: Normal rate, regular rhythm and normal heart sounds.   Pulmonary/Chest: Effort normal and breath sounds normal.  Abdominal: He exhibits no distension. There is no tenderness.       He localizes pain transversely across the midabdomen. There is no palpable deformity or point tenderness. There is no hernia present.  Bowel sounds were normal.  Musculoskeletal:       Patient complains of pain in the left knee. There is no visible or palpable deformity, but range of motion of the knee causes some pain. There is no effusion and no ligamentous instability.  Neurological: He is alert and oriented to person, place, and time.       No sensory or motor deficits.   Skin: Skin is warm and dry.  Psychiatric:       Dull, flat affect.    ED Course  Procedures (including critical care time)   Labs Reviewed  CBC  DIFFERENTIAL  COMPREHENSIVE METABOLIC PANEL  LIPASE, BLOOD  URINALYSIS, ROUTINE W REFLEX MICROSCOPIC   11:45 AM Patient was seen and had physical examination. Laboratory tests were ordered. CT of the abdomen was ordered. X-ray of the left knee was ordered because he complained of pain in his left knee, and his exam was nonrevealing.  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  Low QRS voltage.    ST/T Wave abnormalities: normal  Conduction Disutrbances:none  Narrative Interpretation: Abnormal EKG  Old EKG Reviewed: unchanged  1:01 PM EKG suggests old AMI.  Will add cardiac markers. Left knee shows arthritis.  1:48 PM Glucose was low at 48.  IV D50W ordered.    3:30 PM CBG up to 66.  Repeat 1 amp D50W. Waiting for CT result.    4:39 PM CT of abdomen/pelvis showed gallbladder sludge, and also adenopathy in the porta hepatis of unknown cause.  He is currently asymptomatic.  I advised him that it would be safe to go home.  He will need to eat before taking the hypoglycemic medicine glipzide.  He should avoid fatty foods.  He will need to have his reports given to Dr. Redmond School, his physician at [facility].   1. Abdominal  pain, other specified site   2. Cholelithiasis   3. Hypoglycemia         Carleene Cooper III, MD 03/18/12 1650

## 2012-03-18 NOTE — ED Notes (Signed)
Discharge instructions reviewed with pt; verbalizes understanding.   Discharge instructions reviewed with Merdis Delay, RN.  Pt awaiting PTAR for transport back to [facility].  Pt without c/o at this time; no further c/o's voiced.

## 2012-03-18 NOTE — ED Notes (Signed)
Per EMS: Pt from [facility]. Pt c/o of abdomen pain that started Wednesday. Pain described as cramping in the URQ and ULQ. Pt doesn't know if he has taken meds for pain. States "They dont tell me what they give me". VS normal CBG 91. Strong hand grips. No distress at this time. Pt wants to just get checked out.

## 2012-03-18 NOTE — Discharge Instructions (Signed)
Abdominal Pain Abdominal pain can be caused by many things. Your caregiver decides the seriousness of your pain by an examination and possibly blood tests and X-rays. Many cases can be observed and treated at home. Most abdominal pain is not caused by a disease and will probably improve without treatment. However, in many cases, more time must pass before a clear cause of the pain can be found. Before that point, it may not be known if you need more testing, or if hospitalization or surgery is needed. HOME CARE INSTRUCTIONS   Do not take laxatives unless directed by your caregiver.   Take pain medicine only as directed by your caregiver.   Only take over-the-counter or prescription medicines for pain, discomfort, or fever as directed by your caregiver.   Try a clear liquid diet (broth, tea, or water) for as long as directed by your caregiver. Slowly move to a bland diet as tolerated.  SEEK IMMEDIATE MEDICAL CARE IF:   The pain does not go away.   You have a fever.   You keep throwing up (vomiting).   The pain is felt only in portions of the abdomen. Pain in the right side could possibly be appendicitis. In an adult, pain in the left lower portion of the abdomen could be colitis or diverticulitis.   You pass bloody or black tarry stools.  MAKE SURE YOU:   Understand these instructions.   Will watch your condition.   Will get help right away if you are not doing well or get worse.  Document Released: 08/18/2005 Document Revised: 10/28/2011 Document Reviewed: 06/26/2008 [facility] Patient Information 2012 [employer], [place].   MR. [name] HAD PHYSICAL EXAMINATION, LABORATORY TESTS, AND CT X-RAY OF THE ABDOMEN TO CHECK ON HIM FOR ABDOMINAL PAIN.  HIS TESTS SHOWED A LOW BLOOD SUGAR. HE WILL NEED TO EAT BEFORE HE TAKES GLIPIZIDE.  HIS CT X-RAY OF THE ABDOMEN SHOWED GALLSTONES BUT NO CHOLECYSTITIS (NO INFLAMMATION OF THE GALL BLADDER).  HE WILL NEED TO AVOID EATING FATTY FOODS SO THAT HIS  GALL BLADDER DOES NOT HURT.  THE RADIOLOGISTS ALSO NOTED THAT HE HAS ENLARGED LYMPH NODES IN HIS ABDOMEN.  THIS WILL NEED TO BE FOLLOWED BY HIS PHYSICIAN, DR. HENRY TRIPP.  PLEASE BRING [name]'S LAB TESTS AND X-RAY RESULTS TO DR. TRIPP'S ATTENTION.

## 2012-04-21 ENCOUNTER — Emergency Department (HOSPITAL_COMMUNITY)
Admission: EM | Admit: 2012-04-21 | Discharge: 2012-04-22 | Disposition: A | Discharge status: Home / Self Care | Payer: Medicaid Other | Attending: Emergency Medicine | Admitting: Emergency Medicine

## 2012-04-21 ENCOUNTER — Encounter (HOSPITAL_COMMUNITY): Payer: Self-pay | Admitting: Emergency Medicine

## 2012-04-21 DIAGNOSIS — R110 Nausea: Secondary | ICD-10-CM | POA: Insufficient documentation

## 2012-04-21 DIAGNOSIS — I10 Essential (primary) hypertension: Secondary | ICD-10-CM | POA: Insufficient documentation

## 2012-04-21 DIAGNOSIS — R109 Unspecified abdominal pain: Secondary | ICD-10-CM | POA: Insufficient documentation

## 2012-04-21 DIAGNOSIS — E119 Type 2 diabetes mellitus without complications: Secondary | ICD-10-CM | POA: Insufficient documentation

## 2012-04-21 LAB — DIFFERENTIAL
Basophils Absolute: 0 10*3/uL (ref 0.0–0.1)
Eosinophils Absolute: 0.2 10*3/uL (ref 0.0–0.7)
Eosinophils Relative: 2 % (ref 0–5)
Lymphocytes Relative: 15 % (ref 12–46)
Monocytes Absolute: 0.7 10*3/uL (ref 0.1–1.0)

## 2012-04-21 LAB — CBC
HCT: 39.2 % (ref 39.0–52.0)
MCH: 29.2 pg (ref 26.0–34.0)
MCHC: 33.2 g/dL (ref 30.0–36.0)
MCV: 88.1 fL (ref 78.0–100.0)
Platelets: 105 10*3/uL — ABNORMAL LOW (ref 150–400)
RDW: 14.3 % (ref 11.5–15.5)
WBC: 9.2 10*3/uL (ref 4.0–10.5)

## 2012-04-21 LAB — POCT I-STAT, CHEM 8
BUN: 26 mg/dL — ABNORMAL HIGH (ref 6–23)
Calcium, Ion: 1.16 mmol/L (ref 1.12–1.32)
Chloride: 107 mEq/L (ref 96–112)
Glucose, Bld: 79 mg/dL (ref 70–99)
HCT: 39 % (ref 39.0–52.0)
TCO2: 23 mmol/L (ref 0–100)

## 2012-04-21 MED ORDER — ONDANSETRON 4 MG PO TBDP
8.0000 mg | ORAL_TABLET | Freq: Once | ORAL | Status: AC
  Administered 2012-04-22: 8 mg via ORAL
  Filled 2012-04-21: qty 2

## 2012-04-21 MED ORDER — OXYCODONE-ACETAMINOPHEN 5-325 MG PO TABS
1.0000 | ORAL_TABLET | Freq: Once | ORAL | Status: AC
  Administered 2012-04-22: 1 via ORAL
  Filled 2012-04-21: qty 1

## 2012-04-21 NOTE — ED Notes (Signed)
Patient with week long history of abdominal pain, hurts worse after eating.  Patient denies any nausea, vomiting at this time.  Patient is CAOx3.  Patient is from [facility].

## 2012-04-22 LAB — URINALYSIS, ROUTINE W REFLEX MICROSCOPIC
Bilirubin Urine: NEGATIVE
Glucose, UA: NEGATIVE mg/dL
Hgb urine dipstick: NEGATIVE
Ketones, ur: NEGATIVE mg/dL
Leukocytes, UA: NEGATIVE
Nitrite: NEGATIVE
Protein, ur: NEGATIVE mg/dL
Specific Gravity, Urine: 1.012 (ref 1.005–1.030)
Urobilinogen, UA: 1 mg/dL (ref 0.0–1.0)
pH: 5.5 (ref 5.0–8.0)

## 2012-04-22 LAB — HEPATIC FUNCTION PANEL
ALT: 21 U/L (ref 0–53)
AST: 18 U/L (ref 0–37)
Albumin: 3.6 g/dL (ref 3.5–5.2)
Alkaline Phosphatase: 66 U/L (ref 39–117)
Bilirubin, Direct: 0.1 mg/dL (ref 0.0–0.3)
Indirect Bilirubin: 0.4 mg/dL (ref 0.3–0.9)
Total Bilirubin: 0.5 mg/dL (ref 0.3–1.2)
Total Protein: 6.9 g/dL (ref 6.0–8.3)

## 2012-04-22 LAB — LIPASE, BLOOD: Lipase: 32 U/L (ref 11–59)

## 2012-04-22 NOTE — Discharge Instructions (Signed)
YOU SHOULD HAVE AN ULTRASOUND OF YOUR BELLY IN THE NEXT WEEK TO LOOK FOR ANY GALLSTONES RETURN FOR WORSENED PAIN, VOMITING OR FEVER ABOVE 100.19F OVER THE NEXT 24 HOURS  Abdominal (belly) pain can be caused by many things. any cases can be observed and treated at home after initial evaluation in the emergency department. Even though you are being discharged home, abdominal pain can be unpredictable. Therefore, you need a repeated exam if your pain does not resolve, returns, or worsens. Most patients with abdominal pain don't have to be admitted to the hospital or have surgery, but serious problems like appendicitis and gallbladder attacks can start out as nonspecific pain. Many abdominal conditions cannot be diagnosed in one visit, so follow-up evaluations are very important. SEEK IMMEDIATE MEDICAL ATTENTION IF: The pain does not go away or becomes severe, particularly over the next 8-12 hours.  A temperature above 100.19F develops.  Repeated vomiting occurs (multiple episodes).  The pain becomes localized to portions of the abdomen. The right side could possibly be appendicitis. In an adult, the left lower portion of the abdomen could be colitis or diverticulitis.  Blood is being passed in stools or vomit (bright red or black tarry stools).  Return also if you develop chest pain, difficulty breathing, dizziness or fainting, or become confused, poorly responsive

## 2012-04-22 NOTE — ED Notes (Signed)
Discharge instructions given to patient and to Clydie Braun at [facility].  Patient awaiting PTAR for transport back to nursing home.

## 2012-04-22 NOTE — ED Provider Notes (Signed)
History     CSN: [id number]  Arrival date & time 04/21/12  2215   First MD Initiated Contact with Patient 04/21/12 2259      Chief Complaint  Patient presents with  . Abdominal Pain     Patient is a 56 y.o. male presenting with abdominal pain. The history is provided by the patient.  Abdominal Pain The primary symptoms of the illness include abdominal pain and nausea. The primary symptoms of the illness do not include fever, vomiting, diarrhea, hematemesis or hematochezia. The current episode started more than 2 days ago. The onset of the illness was gradual. The problem has been gradually worsening.  The illness is associated with eating. The patient has not had a change in bowel habit. Symptoms associated with the illness do not include chills or urgency.  pt reports abdominal pain, mostly upper abdomen for at least a month Seen in ED previously for this, pain is similar Denies cp/sob No dysuria No groin or lower abdominal pain  Past Medical History  Diagnosis Date  . Hypertension   . Diabetes mellitus   . Hypercholesterolemia   . GERD (gastroesophageal reflux disease)   . Psychosis   . Degenerative disc disease   . Degenerative disc disease   . Major depressive disorder     History reviewed. No pertinent past surgical history.  No family history on file.  History  Substance Use Topics  . Smoking status: Never Smoker   . Smokeless tobacco: Not on file  . Alcohol Use: No      Review of Systems  Constitutional: Negative for fever and chills.  Gastrointestinal: Positive for nausea and abdominal pain. Negative for vomiting, diarrhea, hematochezia and hematemesis.  Genitourinary: Negative for urgency.  All other systems reviewed and are negative.    Allergies  Review of patient's allergies indicates no known allergies.  Home Medications   Current Outpatient Rx  Name Route Sig Dispense Refill  . CLONAZEPAM 0.5 MG PO TABS Oral Take 0.5 mg by mouth 2 (two)  times daily.     Marland Kitchen DICLOFENAC SODIUM 75 MG PO TBEC Oral Take 75 mg by mouth 2 (two) times daily.    Marland Kitchen FERROUS SULFATE 325 (65 FE) MG PO TBEC Oral Take 325 mg by mouth 2 (two) times daily with a meal.    . GLIPIZIDE 5 MG PO TABS Oral Take 5 mg by mouth 2 (two) times daily before a meal.    . HALOPERIDOL 1 MG PO TABS Oral Take 1 mg by mouth 2 (two) times daily.    Marland Kitchen LURASIDONE HCL 40 MG PO TABS Oral Take by mouth daily with breakfast.    . OMEPRAZOLE 20 MG PO CPDR Oral Take 20 mg by mouth daily.    . SERTRALINE HCL 100 MG PO TABS Oral Take 200 mg by mouth daily.     Marland Kitchen SIMVASTATIN 10 MG PO TABS Oral Take 10 mg by mouth at bedtime.    . TOPIRAMATE 25 MG PO TABS Oral Take 25 mg by mouth at bedtime.    . TRAZODONE HCL 50 MG PO TABS Oral Take 50 mg by mouth at bedtime.    . TRIHEXYPHENIDYL HCL 5 MG PO TABS Oral Take 5 mg by mouth at bedtime.      BP 104/72  Pulse 88  Temp(Src) 99.1 F (37.3 C) (Oral)  Resp 18  SpO2 96%  Physical Exam CONSTITUTIONAL: Well developed/well nourished HEAD AND FACE: Normocephalic/atraumatic EYES: EOMI/PERRL, no icterus noted ENMT: Mucous membranes  moist NECK: supple no meningeal signs SPINE:entire spine nontender CV: S1/S2 noted, no murmurs/rubs/gallops noted LUNGS: Lungs are clear to auscultation bilaterally, no apparent distress ABDOMEN: soft, tenderness in upper abdomen and tenderness is mild, neg murphys signs, no rebound/guarding, +Bs, obesity noted, no lower abd tenderness, no hernia noted GU:no cva tenderness NEURO: Pt is awake/alert, moves all extremitiesx4 EXTREMITIES: pulses normal, full ROM SKIN: warm, color normal PSYCH: flat affect  ED Course  Procedures  Labs Reviewed  CBC - Abnormal; Notable for the following:    Platelets 105 (*) PLATELET COUNT CONFIRMED BY SMEAR   All other components within normal limits  POCT I-STAT, CHEM 8 - Abnormal; Notable for the following:    BUN 26 (*)    All other components within normal limits    DIFFERENTIAL  URINALYSIS, ROUTINE W REFLEX MICROSCOPIC  LACTIC ACID, PLASMA  HEPATIC FUNCTION PANEL  LIPASE, BLOOD  12:27 AM Pt seen in April for similar type pain, had Ct imaging that showed gallbladder sludge He is nontoxic in appearance, mild tenderness noted  Pt improved after OBS in ed, taking PO, abd soft He may need outpatient abdominal US, but doubt acute cholecystitis (previous CT showed GB sludge)  The patient appears reasonably screened and/or stabilized for discharge and I doubt any other medical condition or other [facility] requiring further screening, evaluation, or treatment in the ED at this time prior to discharge.    MDM  Nursing notes reviewed and considered in documentation All labs/vitals reviewed and considered        Date: 04/22/2012  Rate: 79  Rhythm: normal sinus rhythm  QRS Axis: left  Intervals: normal  ST/T Wave abnormalities: nonspecific ST changes  Conduction Disutrbances:none     Joya Gaskins, MD 04/22/12 [phone]

## 2012-06-14 ENCOUNTER — Other Ambulatory Visit: Payer: Self-pay | Admitting: Gastroenterology

## 2012-06-14 DIAGNOSIS — R109 Unspecified abdominal pain: Secondary | ICD-10-CM

## 2012-06-19 ENCOUNTER — Ambulatory Visit
Admission: RE | Admit: 2012-06-19 | Discharge: 2012-06-19 | Disposition: A | Discharge status: Home / Self Care | Payer: Medicaid Other | Source: Ambulatory Visit | Attending: Gastroenterology | Admitting: Gastroenterology

## 2012-06-19 ENCOUNTER — Inpatient Hospital Stay: Admission: RE | Admit: 2012-06-19 | Payer: Medicaid Other | Source: Ambulatory Visit

## 2012-06-19 DIAGNOSIS — R109 Unspecified abdominal pain: Secondary | ICD-10-CM

## 2012-06-19 MED ORDER — IOHEXOL 300 MG/ML  SOLN
125.0000 mL | Freq: Once | INTRAMUSCULAR | Status: AC | PRN
  Administered 2012-06-19: 125 mL via INTRAVENOUS

## 2012-06-20 ENCOUNTER — Other Ambulatory Visit: Payer: Self-pay | Admitting: Gastroenterology

## 2012-06-20 DIAGNOSIS — R935 Abnormal findings on diagnostic imaging of other abdominal regions, including retroperitoneum: Secondary | ICD-10-CM

## 2012-06-20 DIAGNOSIS — K8020 Calculus of gallbladder without cholecystitis without obstruction: Secondary | ICD-10-CM

## 2012-07-04 ENCOUNTER — Ambulatory Visit
Admission: RE | Admit: 2012-07-04 | Discharge: 2012-07-04 | Disposition: A | Discharge status: Home / Self Care | Payer: Medicaid Other | Source: Ambulatory Visit | Attending: Gastroenterology | Admitting: Gastroenterology

## 2012-07-04 DIAGNOSIS — K8020 Calculus of gallbladder without cholecystitis without obstruction: Secondary | ICD-10-CM

## 2012-07-04 DIAGNOSIS — R935 Abnormal findings on diagnostic imaging of other abdominal regions, including retroperitoneum: Secondary | ICD-10-CM

## 2012-08-25 ENCOUNTER — Encounter (HOSPITAL_COMMUNITY): Payer: Self-pay | Admitting: *Deleted

## 2012-08-25 ENCOUNTER — Emergency Department (HOSPITAL_COMMUNITY)
Admission: EM | Admit: 2012-08-25 | Discharge: 2012-08-25 | Disposition: A | Discharge status: Home / Self Care | Payer: Medicaid Other | Attending: Emergency Medicine | Admitting: Emergency Medicine

## 2012-08-25 DIAGNOSIS — F329 Major depressive disorder, single episode, unspecified: Secondary | ICD-10-CM | POA: Insufficient documentation

## 2012-08-25 DIAGNOSIS — E119 Type 2 diabetes mellitus without complications: Secondary | ICD-10-CM | POA: Insufficient documentation

## 2012-08-25 DIAGNOSIS — W19XXXA Unspecified fall, initial encounter: Secondary | ICD-10-CM

## 2012-08-25 DIAGNOSIS — I10 Essential (primary) hypertension: Secondary | ICD-10-CM | POA: Insufficient documentation

## 2012-08-25 DIAGNOSIS — K219 Gastro-esophageal reflux disease without esophagitis: Secondary | ICD-10-CM | POA: Insufficient documentation

## 2012-08-25 DIAGNOSIS — IMO0002 Reserved for concepts with insufficient information to code with codable children: Secondary | ICD-10-CM | POA: Insufficient documentation

## 2012-08-25 DIAGNOSIS — E7800 Pure hypercholesterolemia, unspecified: Secondary | ICD-10-CM | POA: Insufficient documentation

## 2012-08-25 DIAGNOSIS — Y9210 Unspecified residential institution as the place of occurrence of the external cause: Secondary | ICD-10-CM | POA: Insufficient documentation

## 2012-08-25 DIAGNOSIS — W010XXA Fall on same level from slipping, tripping and stumbling without subsequent striking against object, initial encounter: Secondary | ICD-10-CM | POA: Insufficient documentation

## 2012-08-25 DIAGNOSIS — T148XXA Other injury of unspecified body region, initial encounter: Secondary | ICD-10-CM

## 2012-08-25 MED ORDER — ACETAMINOPHEN 325 MG PO TABS
650.0000 mg | ORAL_TABLET | Freq: Once | ORAL | Status: AC
  Administered 2012-08-25: 650 mg via ORAL
  Filled 2012-08-25: qty 2

## 2012-08-25 NOTE — ED Provider Notes (Signed)
History     CSN: [id number]  Arrival date & time 08/25/12  0407   First MD Initiated Contact with Patient 08/25/12 0417      No chief complaint on file.   (Consider location/radiation/quality/duration/timing/severity/associated sxs/prior treatment) HPI Comments: Patient got up to use the rest room and slipped landing on his knees than falling back and hitting his head on the floor without LOC nor head ache   The history is provided by the patient.    Past Medical History  Diagnosis Date  . Hypertension   . Diabetes mellitus   . Hypercholesterolemia   . GERD (gastroesophageal reflux disease)   . Psychosis   . Degenerative disc disease   . Degenerative disc disease   . Major depressive disorder     History reviewed. No pertinent past surgical history.  No family history on file.  History  Substance Use Topics  . Smoking status: Never Smoker   . Smokeless tobacco: Not on file  . Alcohol Use: No      Review of Systems  Constitutional: Negative for fever and chills.  Eyes: Negative for visual disturbance.  Gastrointestinal: Positive for nausea.  Musculoskeletal: Negative for joint swelling.  Skin: Positive for wound.  Neurological: Negative for weakness and headaches.    Allergies  Review of patient's allergies indicates no known allergies.  Home Medications   Current Outpatient Rx  Name Route Sig Dispense Refill  . VITAMIN D 2000 UNITS PO TABS Oral Take 2,000 Units by mouth daily.    Marland Kitchen CLONAZEPAM 0.5 MG PO TABS Oral Take 0.5 mg by mouth 2 (two) times daily.     Marland Kitchen DICLOFENAC SODIUM 75 MG PO TBEC Oral Take 75 mg by mouth 2 (two) times daily.    Marland Kitchen FERROUS SULFATE 325 (65 FE) MG PO TBEC Oral Take 325 mg by mouth 2 (two) times daily with a meal.    . GLIPIZIDE 5 MG PO TABS Oral Take 5 mg by mouth 2 (two) times daily before a meal.    . HALOPERIDOL 1 MG PO TABS Oral Take 1 mg by mouth 3 (three) times daily.     . L-METHYLFOLATE-B6-B12 3-35-2 MG PO TABS Oral Take  0.5 tablets by mouth daily.    Marland Kitchen LURASIDONE HCL 40 MG PO TABS Oral Take 40 mg by mouth daily with breakfast. Take with latuda 20mg     . LURASIDONE HCL 20 MG PO TABS Oral Take 20 mg by mouth daily. Take with latuda 40mg     . OMEPRAZOLE 20 MG PO CPDR Oral Take 20 mg by mouth daily.    . SERTRALINE HCL 100 MG PO TABS Oral Take 100 mg by mouth 2 (two) times daily.     Marland Kitchen SIMVASTATIN 10 MG PO TABS Oral Take 10 mg by mouth at bedtime.    . TOPIRAMATE 25 MG PO TABS Oral Take 25 mg by mouth at bedtime.    . TRAZODONE HCL 50 MG PO TABS Oral Take 50 mg by mouth at bedtime.    . TRIHEXYPHENIDYL HCL 5 MG PO TABS Oral Take 5 mg by mouth at bedtime.      BP 116/64  Pulse 66  Temp 97 F (36.1 C) (Oral)  Resp 18  SpO2 97%  Physical Exam  Constitutional: He is oriented to person, place, and time. He appears well-developed and well-nourished.  HENT:  Head: Normocephalic and atraumatic.  Eyes: Pupils are equal, round, and reactive to light.  Neck: Normal range of motion.  Cardiovascular:  Normal rate.   Abdominal: Soft.  Musculoskeletal: Normal range of motion. He exhibits tenderness.       Abrasion to L knee  Neurological: He is alert and oriented to person, place, and time.  Skin: Skin is warm.    ED Course  Procedures (including critical care time)  Labs Reviewed - No data to display No results found.   1. Fall   2. Abrasion       MDM  will ambulate again and medicate with Tylenol for muscle aches         Arman Filter, NP 08/25/12 [phone]

## 2012-08-25 NOTE — ED Notes (Signed)
Pt ambulated up hallway.

## 2012-08-25 NOTE — ED Provider Notes (Signed)
Medical screening examination/treatment/procedure(s) were performed by non-physician practitioner and as supervising physician I was immediately available for consultation/collaboration.   Marigene Erler B. Bernette Mayers, MD 08/25/12 2130

## 2012-08-25 NOTE — ED Notes (Addendum)
Per EMS:  Pt from [facility] nursing facility.  Pt got up to use the bathroom and fell on his knees.  Pt said he fell back and hit his head, however there are no marks on his head, no headache, no LOC, no change in neuro status.  Pt able to ambulate on scene and once he got here.

## 2012-09-04 ENCOUNTER — Ambulatory Visit (INDEPENDENT_AMBULATORY_CARE_PROVIDER_SITE_OTHER): Payer: Self-pay | Admitting: General Surgery

## 2012-09-08 ENCOUNTER — Emergency Department (HOSPITAL_COMMUNITY): Payer: Medicaid Other

## 2012-09-08 ENCOUNTER — Encounter (HOSPITAL_COMMUNITY): Payer: Self-pay | Admitting: Emergency Medicine

## 2012-09-08 ENCOUNTER — Emergency Department (HOSPITAL_COMMUNITY)
Admission: EM | Admit: 2012-09-08 | Discharge: 2012-09-08 | Disposition: A | Discharge status: Home Health | Payer: Medicaid Other | Attending: Emergency Medicine | Admitting: Emergency Medicine

## 2012-09-08 DIAGNOSIS — Z79899 Other long term (current) drug therapy: Secondary | ICD-10-CM | POA: Insufficient documentation

## 2012-09-08 DIAGNOSIS — M25569 Pain in unspecified knee: Secondary | ICD-10-CM | POA: Insufficient documentation

## 2012-09-08 DIAGNOSIS — W010XXA Fall on same level from slipping, tripping and stumbling without subsequent striking against object, initial encounter: Secondary | ICD-10-CM | POA: Insufficient documentation

## 2012-09-08 DIAGNOSIS — E119 Type 2 diabetes mellitus without complications: Secondary | ICD-10-CM | POA: Insufficient documentation

## 2012-09-08 DIAGNOSIS — W19XXXA Unspecified fall, initial encounter: Secondary | ICD-10-CM

## 2012-09-08 DIAGNOSIS — I10 Essential (primary) hypertension: Secondary | ICD-10-CM | POA: Insufficient documentation

## 2012-09-08 DIAGNOSIS — Z23 Encounter for immunization: Secondary | ICD-10-CM | POA: Insufficient documentation

## 2012-09-08 DIAGNOSIS — Y9229 Other specified public building as the place of occurrence of the external cause: Secondary | ICD-10-CM | POA: Insufficient documentation

## 2012-09-08 DIAGNOSIS — R51 Headache: Secondary | ICD-10-CM | POA: Insufficient documentation

## 2012-09-08 MED ORDER — TETANUS-DIPHTH-ACELL PERTUSSIS 5-2.5-18.5 LF-MCG/0.5 IM SUSP
0.5000 mL | Freq: Once | INTRAMUSCULAR | Status: AC
  Administered 2012-09-08: 0.5 mL via INTRAMUSCULAR
  Filled 2012-09-08: qty 0.5

## 2012-09-08 MED ORDER — HYDROCODONE-ACETAMINOPHEN 5-325 MG PO TABS
2.0000 | ORAL_TABLET | Freq: Once | ORAL | Status: AC
  Administered 2012-09-08: 2 via ORAL
  Filled 2012-09-08: qty 2

## 2012-09-08 NOTE — ED Notes (Signed)
Offered snack. Pt states he will wait until he gets back home

## 2012-09-08 NOTE — ED Notes (Signed)
[facility]  Phoned for report. ptar notified of transport. Nursing home states they cannot transport pt

## 2012-09-08 NOTE — ED Notes (Signed)
cbg- 100 

## 2012-09-08 NOTE — ED Notes (Signed)
Pt to ED from Nursing Home after reported falling while going to bathroom.  Pt st's he knew he was gonna fall so he went down onto his knees then fell backwards hitting his head on the floor.  Pt denies LOC.  Hematoma to back of head. Pt alert and oriented x's 3

## 2012-09-08 NOTE — ED Provider Notes (Addendum)
History     CSN: [id number]  Arrival date & time 09/08/12  0128   First MD Initiated Contact with Patient 09/08/12 [phone]      Chief Complaint  Patient presents with  . Fall    (Consider location/radiation/quality/duration/timing/severity/associated sxs/prior treatment) HPI Patient reports he slipped and fell on wet surface tonight while at the assisted-living facility. He complains of pain at his occiput and bilateral knee pain he denies other complaint no treatment prior to coming here pain is moderate to severe, nonradiating nothing makes symptoms better or worse Past Medical History  Diagnosis Date  . Hypertension   . Diabetes mellitus   . Hypercholesterolemia   . GERD (gastroesophageal reflux disease)   . Psychosis   . Degenerative disc disease   . Degenerative disc disease   . Major depressive disorder     History reviewed. No pertinent past surgical history.  History reviewed. No pertinent family history.  History  Substance Use Topics  . Smoking status: Never Smoker   . Smokeless tobacco: Not on file  . Alcohol Use: No      Review of Systems  Constitutional: Negative.   Respiratory: Negative.   Cardiovascular: Negative.   Gastrointestinal: Negative.   Musculoskeletal: Positive for arthralgias.       Bilateral knee pain  Skin: Negative.   Neurological: Positive for headaches.  Hematological: Negative.   Psychiatric/Behavioral: Negative.   All other systems reviewed and are negative.    Allergies  Review of patient's allergies indicates no known allergies.  Home Medications   Current Outpatient Rx  Name Route Sig Dispense Refill  . VITAMIN D 2000 UNITS PO TABS Oral Take 2,000 Units by mouth daily.    Marland Kitchen CLONAZEPAM 0.5 MG PO TABS Oral Take 0.5 mg by mouth 2 (two) times daily.     Marland Kitchen DICLOFENAC SODIUM 75 MG PO TBEC Oral Take 75 mg by mouth 2 (two) times daily.    Marland Kitchen FERROUS SULFATE 325 (65 FE) MG PO TBEC Oral Take 325 mg by mouth 2 (two) times daily  with a meal.    . GLIPIZIDE 5 MG PO TABS Oral Take 5 mg by mouth 2 (two) times daily before a meal.    . HALOPERIDOL 1 MG PO TABS Oral Take 1 mg by mouth 3 (three) times daily.     . L-METHYLFOLATE-B6-B12 3-35-2 MG PO TABS Oral Take 0.5 tablets by mouth daily.    Marland Kitchen LURASIDONE HCL 40 MG PO TABS Oral Take 40 mg by mouth daily with breakfast. Take with latuda 20mg     . OMEPRAZOLE 20 MG PO CPDR Oral Take 20 mg by mouth daily.    . SERTRALINE HCL 100 MG PO TABS Oral Take 100 mg by mouth 2 (two) times daily.     Marland Kitchen SIMVASTATIN 10 MG PO TABS Oral Take 10 mg by mouth at bedtime.    . TOPIRAMATE 25 MG PO TABS Oral Take 25 mg by mouth at bedtime.    . TRAZODONE HCL 50 MG PO TABS Oral Take 50 mg by mouth at bedtime.    . TRIHEXYPHENIDYL HCL 5 MG PO TABS Oral Take 5 mg by mouth at bedtime.      BP 117/73  Temp 98 F (36.7 C) (Oral)  Resp 20  SpO2 95%  Physical Exam  Nursing note and vitals reviewed. Constitutional: He appears well-developed and well-nourished.  HENT:  Right Ear: External ear normal.  Left Ear: External ear normal.       Call  for size hematoma to occiput, bilateral tympanic membranes normal. Otherwise normocephalic atraumatic  Eyes: Conjunctivae normal are normal. Pupils are equal, round, and reactive to light.  Neck: Normal range of motion. Neck supple. No tracheal deviation present. No thyromegaly present.       Tender over cervical spine  Cardiovascular: Normal rate and regular rhythm.   No murmur heard. Pulmonary/Chest: Effort normal and breath sounds normal.  Abdominal: Soft. Bowel sounds are normal. He exhibits no distension. There is no tenderness.       obese  Musculoskeletal: Normal range of motion. He exhibits no edema and no tenderness.       Bilateral lower extremity is tender anteriorly at knees. Left knee with 3 cm abrasion  Neurological: He is alert. He exhibits normal muscle tone. Coordination normal.       Motor strength 5 over 5 overall  Skin: Skin is warm  and dry. No rash noted.  Psychiatric: He has a normal mood and affect.    ED Course  Procedures (including critical care time)  Labs Reviewed - No data to display No results found. Results for orders placed during the hospital encounter of 09/08/12  GLUCOSE, CAPILLARY      Component Value Range   Glucose-Capillary 100 (*) 70 - 99 mg/dL   Ct Head Wo Contrast  09/08/2012  *RADIOLOGY REPORT*  Clinical Data:  Pain after fall.  Hematoma to the back of the head.  CT HEAD WITHOUT CONTRAST CT CERVICAL SPINE WITHOUT CONTRAST  Technique:  Multidetector CT imaging of the head and cervical spine was performed following the standard protocol without intravenous contrast.  Multiplanar CT image reconstructions of the cervical spine were also generated.  Comparison:  01/20/2011  CT HEAD  Findings: Subcutaneous soft tissue scalp hematoma over the posterior skull.  No underlying skull fractures.  Ventricles and sulci appear symmetrical.  No mass effect or midline shift.  No abnormal extra-axial fluid collections.  Gray-white matter junctions are distinct.  Basal cisterns are not effaced.  No evidence of acute intracranial hemorrhage.  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  This appears stable. Visualized paranasal sinuses and mastoid air cells are not opacified.  No significant change since previous study.  IMPRESSION: No acute intracranial abnormalities.  Soft tissue hematoma in the posterior scalp.  CT CERVICAL SPINE  Findings: Normal alignment of the cervical vertebrae and facet joints.  Lateral masses of C1 appear symmetrical.  The odontoid process appears intact.  No prevertebral soft tissue swelling. Degenerative changes at C5-6 and C6-7 with narrowed cervical interspaces and endplate hypertrophic changes.  No vertebral compression deformities.  No focal bone lesion or bone destruction. Bone cortex and trabecular architecture  appear intact.  IMPRESSION: No displaced fractures identified.  Mild degenerative changes.  No significant change since previous study.   Original Report Authenticated By: Marlon Pel, M.D.    Ct Cervical Spine Wo Contrast  09/08/2012  *RADIOLOGY REPORT*  Clinical Data:  Pain after fall.  Hematoma to the back of the head.  CT HEAD WITHOUT CONTRAST CT CERVICAL SPINE WITHOUT CONTRAST  Technique:  Multidetector CT imaging of the head and cervical spine was performed following the standard protocol without intravenous contrast.  Multiplanar CT image reconstructions of the cervical spine were also generated.  Comparison:  01/20/2011  CT HEAD  Findings: Subcutaneous soft tissue scalp hematoma over the posterior skull.  No underlying skull fractures.  Ventricles and sulci appear symmetrical.  No mass effect or midline shift.  No abnormal extra-axial fluid collections.  Gray-white matter junctions are distinct.  Basal cisterns are not effaced.  No evidence of acute intracranial hemorrhage.  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  This appears stable. Visualized paranasal sinuses and mastoid air cells are not opacified.  No significant change since previous study.  IMPRESSION: No acute intracranial abnormalities.  Soft tissue hematoma in the posterior scalp.  CT CERVICAL SPINE  Findings: Normal alignment of the cervical vertebrae and facet joints.  Lateral masses of C1 appear symmetrical.  The odontoid process appears intact.  No prevertebral soft tissue swelling. Degenerative changes at C5-6 and C6-7 with narrowed cervical interspaces and endplate hypertrophic changes.  No vertebral compression deformities.  No focal bone lesion or bone destruction. Bone cortex and trabecular architecture appear intact.  IMPRESSION: No displaced fractures identified.  Mild degenerative changes.  No significant change since previous study.    Original Report Authenticated By: Marlon Pel, M.D.    Dg Knee Complete 4 Views Left  09/08/2012  *RADIOLOGY REPORT*  Clinical Data: The knee pain after fall.  LEFT KNEE - COMPLETE 4+ VIEW  Comparison: 03/18/2012  Findings: Tricompartmental degenerative changes in the left knee. No evidence of acute fracture or subluxation.  No focal bone lesion or bone destruction.  No significant effusion.  No significant change since the previous study.  IMPRESSION: Degenerative changes in the left knee.  No acute bony abnormalities.   Original Report Authenticated By: Marlon Pel, M.D.    Dg Knee Complete 4 Views Right  09/08/2012  *RADIOLOGY REPORT*  Clinical Data: Knee pain after fall.  RIGHT KNEE - COMPLETE 4+ VIEW  Comparison: None.  Findings: Tricompartmental degenerative changes in the right knee. No evidence of acute fracture or subluxation.  No focal bone lesion or bone destruction.  No significant effusion.  Vascular calcifications.  IMPRESSION: Degenerative changes in the right knee.  No acute fractures demonstrated.   Original Report Authenticated By: Marlon Pel, M.D.      No diagnosis found.  X-rays reviewed by me 7:30 PM pain is improved patient is alert ambulates without assistance  MDM  Plan discharged in the assisted-living facility. Tylenol for pain Diagnosis #1 fall  #2 minor closed head injury #3 contusions multiple sites        Doug Sou, MD 09/08/12 4098  Doug Sou, MD 09/08/12 [phone]

## 2012-09-08 NOTE — ED Notes (Signed)
[facility] here to transport pt

## 2012-09-21 ENCOUNTER — Encounter (INDEPENDENT_AMBULATORY_CARE_PROVIDER_SITE_OTHER): Payer: Self-pay | Admitting: General Surgery

## 2012-09-21 ENCOUNTER — Ambulatory Visit (INDEPENDENT_AMBULATORY_CARE_PROVIDER_SITE_OTHER): Payer: Medicaid Other | Admitting: General Surgery

## 2012-09-21 VITALS — BP 130/82 | HR 84 | Temp 98.1°F | Resp 16 | Ht 68.0 in | Wt 267.8 lb

## 2012-09-21 DIAGNOSIS — K8020 Calculus of gallbladder without cholecystitis without obstruction: Secondary | ICD-10-CM

## 2012-09-21 NOTE — Patient Instructions (Signed)
We will need medical clearance prior to scheduling your surgery I will also need to speak with your next of kin before scheduling surgery  Laparoscopic Cholecystectomy Laparoscopic cholecystectomy is surgery to remove the gallbladder. The gallbladder is located slightly to the right of center in the abdomen, behind the liver. It is a concentrating and storage sac for the bile produced in the liver. Bile aids in the digestion and absorption of fats. Gallbladder disease (cholecystitis) is an inflammation of your gallbladder. This condition is usually caused by a buildup of gallstones (cholelithiasis) in your gallbladder. Gallstones can block the flow of bile, resulting in inflammation and pain. In severe cases, emergency surgery may be required. When emergency surgery is not required, you will have time to prepare for the procedure. Laparoscopic surgery is an alternative to open surgery. Laparoscopic surgery usually has a shorter recovery time. Your common bile duct may also need to be examined and explored. Your caregiver will discuss this with you if he or she feels this should be done. If stones are found in the common bile duct, they may be removed. LET YOUR CAREGIVER KNOW ABOUT:  Allergies to food or medicine.  Medicines taken, including vitamins, herbs, eyedrops, over-the-counter medicines, and creams.  Use of steroids (by mouth or creams).  Previous problems with anesthetics or numbing medicines.  History of bleeding problems or blood clots.  Previous surgery.  Other health problems, including diabetes and kidney problems.  Possibility of pregnancy, if this applies. RISKS AND COMPLICATIONS All surgery is associated with risks. Some problems that may occur following this procedure include:  Infection.  Damage to the common bile duct, nerves, arteries, veins, or other internal organs such as the stomach or intestines.  Bleeding.  A stone may remain in the common bile duct. BEFORE  THE PROCEDURE  Do not take aspirin for 3 days prior to surgery or blood thinners for 1 week prior to surgery.  Do not eat or drink anything after midnight the night before surgery.  Let your caregiver know if you develop a cold or other infectious problem prior to surgery.  You should be present 60 minutes before the procedure or as directed. PROCEDURE  You will be given medicine that makes you sleep (general anesthetic). When you are asleep, your surgeon will make several small cuts (incisions) in your abdomen. One of these incisions is used to insert a small, lighted scope (laparoscope) into the abdomen. The laparoscope helps the surgeon see into your abdomen. Carbon dioxide gas will be pumped into your abdomen. The gas allows more room for the surgeon to perform your surgery. Other operating instruments are inserted through the other incisions. Laparoscopic procedures may not be appropriate when:  There is major scarring from previous surgery.  The gallbladder is extremely inflamed.  There are bleeding disorders or unexpected cirrhosis of the liver.  A pregnancy is near term.  Other conditions make the laparoscopic procedure impossible. If your surgeon feels it is not safe to continue with a laparoscopic procedure, he or she will perform an open abdominal procedure. In this case, the surgeon will make an incision to open the abdomen. This gives the surgeon a larger view and field to work within. This may allow the surgeon to perform procedures that sometimes cannot be performed with a laparoscope alone. Open surgery has a longer recovery time. AFTER THE PROCEDURE  You will be taken to the recovery area where a nurse will watch and check your progress.  You may be allowed  to go home the same day.  Do not resume physical activities until directed by your caregiver.  You may resume a normal diet and activities as directed. Document Released: 11/08/2005 Document Revised: 01/31/2012  Document Reviewed: 04/23/2011 [facility] Patient Information 2013 [place], [place].

## 2012-09-21 NOTE — Progress Notes (Signed)
Patient ID: [name], male   DOB: Mar 27, 1956, 56 y.o.   MRN: [id number]  Chief Complaint  Patient presents with  . Cholelithiasis    HPI [name] is a 56 y.o. male.   HPI 56 year old morbidly obese Caucasian male referred by Dr. Redmond School for evaluation of gallstones. The patient has seen Dr. Madilyn Fireman in the past for abdominal pain. The patient is somewhat of a poor historian. He states he has had several months of abdominal pain. He states that it comes and goes. However it occurs every day. He describes it as sharp. He has noticed some correlation after eating certain foods. He denies any fever, chills, nausea, vomiting, melena, hematochezia, acholic stools. He has had abdominal ultrasound as well as a CT scan of his abdomen pelvis  Past Medical History  Diagnosis Date  . Hypertension   . Diabetes mellitus   . Hypercholesterolemia   . GERD (gastroesophageal reflux disease)   . Psychosis   . Degenerative disc disease   . Degenerative disc disease   . Major depressive disorder     Past Surgical History  Procedure Date  . Eye surgery 2003    Family History  Problem Relation Age of Onset  . Colon cancer Father   . Ovarian cancer Sister     Social History History  Substance Use Topics  . Smoking status: Former Smoker    Quit date: 09/21/1992  . Smokeless tobacco: Not on file  . Alcohol Use: No    No Known Allergies  Current Outpatient Prescriptions  Medication Sig Dispense Refill  . Cholecalciferol (VITAMIN D) 2000 UNITS tablet Take 2,000 Units by mouth daily.      . clonazePAM (KLONOPIN) 0.5 MG tablet Take 0.5 mg by mouth 2 (two) times daily.       . diclofenac (VOLTAREN) 75 MG EC tablet Take 75 mg by mouth 2 (two) times daily.      . ferrous sulfate 325 (65 FE) MG EC tablet Take 325 mg by mouth 2 (two) times daily with a meal.      . glipiZIDE (GLUCOTROL) 5 MG tablet Take 5 mg by mouth 2 (two) times daily before a meal.      . haloperidol (HALDOL) 1 MG tablet Take 1 mg  by mouth 3 (three) times daily.       Marland Kitchen l-methylfolate-B6-B12 (METANX) 3-35-2 MG TABS Take 0.5 tablets by mouth daily.      Marland Kitchen lurasidone (LATUDA) 40 MG TABS Take 40 mg by mouth daily with breakfast. Take with latuda 20mg       . omeprazole (PRILOSEC) 20 MG capsule Take 20 mg by mouth daily.      . sertraline (ZOLOFT) 100 MG tablet Take 100 mg by mouth 2 (two) times daily.       . simvastatin (ZOCOR) 10 MG tablet Take 10 mg by mouth at bedtime.      . topiramate (TOPAMAX) 25 MG tablet Take 25 mg by mouth at bedtime.      . traZODone (DESYREL) 50 MG tablet Take 50 mg by mouth at bedtime.      . trihexyphenidyl (ARTANE) 5 MG tablet Take 5 mg by mouth at bedtime.        Review of Systems Review of Systems  Constitutional: Negative for fever, chills, appetite change and unexpected weight change.  HENT: Negative for congestion and trouble swallowing.   Eyes: Negative for visual disturbance.  Respiratory: Negative for chest tightness and shortness of breath.   Cardiovascular: Positive  for chest pain. Negative for leg swelling.       No PND, no orthopnea, + DOE, ?PND  Gastrointestinal:       See HPI  Genitourinary: Negative for dysuria and hematuria.  Musculoskeletal: Negative.   Skin: Negative for rash.  Neurological: Negative for seizures and speech difficulty.       Multiple recent falls; denies TIAs, amaurosis  fugax  Hematological: Does not bruise/bleed easily.  Psychiatric/Behavioral: Positive for behavioral problems. Negative for confusion.       Several pschy admissions, PTSD, MDD; h/o SI    Blood pressure 130/82, pulse 84, temperature 98.1 F (36.7 C), temperature source Temporal, resp. rate 16, height 5\' 8"  (1.727 m), weight 267 lb 12.8 oz (121.473 kg).  Physical Exam Physical Exam  Vitals reviewed. Constitutional: He is oriented to person, place, and time. He appears well-developed and well-nourished. No distress.       Morbidly obese  HENT:  Head: Normocephalic and  atraumatic.  Right Ear: External ear normal.  Left Ear: External ear normal.  Eyes: Conjunctivae normal are normal. No scleral icterus.  Neck: Normal range of motion. Neck supple. No tracheal deviation present. No thyromegaly present.  Cardiovascular: Normal rate, regular rhythm and normal heart sounds.   Pulmonary/Chest: Effort normal and breath sounds normal. No respiratory distress. He has no wheezes.  Abdominal: Soft. Bowel sounds are normal. He exhibits no distension. There is tenderness (very mild RUQ TTP). There is no rebound and no guarding.  Musculoskeletal: He exhibits no edema and no tenderness.  Neurological: He is alert and oriented to person, place, and time. GCS eye subscore is 4. GCS verbal subscore is 5. GCS motor subscore is 6.       Symmetric strength but globally a little weak  Skin: He is not diaphoretic.  Psychiatric: His mood appears not anxious. His affect is labile. His affect is not angry. His speech is delayed. He is slowed. He is not agitated.    Data Reviewed Dr Madilyn Fireman note ED note from a week ago with head and c spine CT  COMPLETE ABDOMINAL ULTRASOUND  Comparison: CT of the abdomen pelvis of 06/19/2012  Findings:  Gallbladder: The gallbladder is somewhat distended and is filled  with multiple shadowing gallstones. There is no pain over  gallbladder with compression however.  Common bile duct: The common bile duct is normal measuring 3.5 mm  in diameter distally.  Liver: The liver is echogenic and inhomogeneous consistent with  fatty infiltration. No focal abnormality is seen.  IVC: The IVC is obscured by bowel gas.  Pancreas: The pancreas also is not well visualized, but appeared  grossly normal on the recent CT of the abdomen.  Spleen: The spleen is enlarged measuring 13.1 cm sagittally with a  volume of 840 ml.  Right Kidney: No hydronephrosis is seen. The right kidney  measures 13.3 cm sagittally.  Left Kidney: No hydronephrosis is noted. The left  kidney measures  12.6 cm.  Abdominal aorta: The abdominal aorta is not well seen due to bowel  gas.  Some of the anatomy is obscured secondary to patient body habitus.  IMPRESSION:  1. Multiple gallstones fill the gallbladder. No pain is present  over the gallbladder with compression.  2. Fatty infiltration of the liver.  3. Splenomegaly.  CT ABDOMEN AND PELVIS WITH CONTRAST  Technique: Multidetector CT imaging of the abdomen and pelvis was  performed following the standard protocol during bolus  administration of intravenous contrast.  Contrast: OMNIPAQUE IOHEXOL  300 MG/ML SOLN  Comparison: CT of abdomen and pelvis 03/18/2012.  Findings:  Lung Bases: Unremarkable.  Abdomen/Pelvis: The superior aspect of the liver is incompletely  visualized. Mild irregular contour of the liver that could again  suggest early changes of cirrhosis. There is some punctate  calcifications within the visualized liver, most compatible with  calcified granulomas. Gallbladder is significantly distended, and  there is layering high attenuation (73 HU) in the dependent portion  of the gallbladder lumen, most compatible with biliary sludge or  multiple tiny faintly radiopaque calculi. The enhanced appearance  of the pancreas, spleen and right adrenal gland is unremarkable.  Calcification in the left adrenal gland is similar to prior study,  likely sequela of prior adrenal hemorrhage or infection. Kidneys  appears slightly atrophic bilaterally.  Small umbilical hernia containing only omental fat. No ascites or  pneumoperitoneum and no pathologic distension of bowel. Normal  appendix. No definite pathologic lymphadenopathy identified within  the abdomen or pelvis.  Musculoskeletal: There are no aggressive appearing lytic or blastic  lesions noted in the visualized portions of the skeleton.  IMPRESSION:  1. There is again high attenuation material layering dependently  within the lumen of the  gallbladder, favored to represent a large  amount of biliary sludge. This is associated with moderate -  severe dilatation of the gallbladder at this time. However, there  is no definite gallbladder wall thickening, pericholecystic fluid  or inflammatory changes to strongly suggest the presence of an  acute cholecystitis at this time. Further evaluation with right  upper quadrant ultrasound may be warranted if there is strong  clinical concern for acute cholecystitis.  2. Small umbilical hernia containing only a small amount of omental  fat.  3. Additional incidental findings, as above.  Assessment    Cholelithiasis, probably symptomatic    Plan    Although the patient is a poor historian I believe his symptoms are probably attributable to his gallbladder.   We discussed gallbladder disease. The patient was given Agricultural engineer. We discussed non-operative and operative management. We discussed the signs & symptoms of acute cholecystitis  I discussed laparoscopic cholecystectomy with IOC in detail.  The patient was given educational material as well as diagrams detailing the procedure.  We discussed the risks and benefits of a laparoscopic cholecystectomy including, but not limited to bleeding, infection, injury to surrounding structures such as the intestine or liver, bile leak, retained gallstones, need to convert to an open procedure, prolonged diarrhea, blood clots such as  DVT, common bile duct injury, anesthesia risks, and possible need for additional procedures.  We discussed the typical post-operative recovery course. I explained that the likelihood of improvement of their symptoms is good.  The patient will need medical clearance prior to scheduling surgery. Moreover the patient was unable to to reexplain gallbladder surgery to me. He was not able to explain any potential risk or complications. Therefore I question the patient's competence to consent to surgery. He tells me  that he signs his own legal documents. I explained to him and the transportation provider that I would need to speak with his next of kin prior to scheduling surgery as well  Mary Sella. Andrey Campanile, MD, FACS General, Bariatric, & Minimally Invasive Surgery [facility], [place]         [facility] 09/21/2012, 9:39 AM

## 2012-10-04 ENCOUNTER — Emergency Department (HOSPITAL_COMMUNITY): Payer: Medicaid Other

## 2012-10-04 ENCOUNTER — Encounter (HOSPITAL_COMMUNITY): Payer: Self-pay

## 2012-10-04 ENCOUNTER — Emergency Department (HOSPITAL_COMMUNITY)
Admission: EM | Admit: 2012-10-04 | Discharge: 2012-10-04 | Disposition: A | Discharge status: Skilled Nursing Facility | Payer: Medicaid Other | Attending: Emergency Medicine | Admitting: Emergency Medicine

## 2012-10-04 DIAGNOSIS — Y9289 Other specified places as the place of occurrence of the external cause: Secondary | ICD-10-CM | POA: Insufficient documentation

## 2012-10-04 DIAGNOSIS — S8011XA Contusion of right lower leg, initial encounter: Secondary | ICD-10-CM

## 2012-10-04 DIAGNOSIS — W19XXXA Unspecified fall, initial encounter: Secondary | ICD-10-CM

## 2012-10-04 DIAGNOSIS — E7800 Pure hypercholesterolemia, unspecified: Secondary | ICD-10-CM | POA: Insufficient documentation

## 2012-10-04 DIAGNOSIS — F329 Major depressive disorder, single episode, unspecified: Secondary | ICD-10-CM | POA: Insufficient documentation

## 2012-10-04 DIAGNOSIS — S8001XA Contusion of right knee, initial encounter: Secondary | ICD-10-CM

## 2012-10-04 DIAGNOSIS — S8010XA Contusion of unspecified lower leg, initial encounter: Secondary | ICD-10-CM | POA: Insufficient documentation

## 2012-10-04 DIAGNOSIS — F3289 Other specified depressive episodes: Secondary | ICD-10-CM | POA: Insufficient documentation

## 2012-10-04 DIAGNOSIS — IMO0002 Reserved for concepts with insufficient information to code with codable children: Secondary | ICD-10-CM | POA: Insufficient documentation

## 2012-10-04 DIAGNOSIS — S20219A Contusion of unspecified front wall of thorax, initial encounter: Secondary | ICD-10-CM | POA: Insufficient documentation

## 2012-10-04 DIAGNOSIS — F29 Unspecified psychosis not due to a substance or known physiological condition: Secondary | ICD-10-CM | POA: Insufficient documentation

## 2012-10-04 DIAGNOSIS — S8000XA Contusion of unspecified knee, initial encounter: Secondary | ICD-10-CM | POA: Insufficient documentation

## 2012-10-04 DIAGNOSIS — Z87891 Personal history of nicotine dependence: Secondary | ICD-10-CM | POA: Insufficient documentation

## 2012-10-04 DIAGNOSIS — W010XXA Fall on same level from slipping, tripping and stumbling without subsequent striking against object, initial encounter: Secondary | ICD-10-CM | POA: Insufficient documentation

## 2012-10-04 DIAGNOSIS — Y9389 Activity, other specified: Secondary | ICD-10-CM | POA: Insufficient documentation

## 2012-10-04 DIAGNOSIS — K219 Gastro-esophageal reflux disease without esophagitis: Secondary | ICD-10-CM | POA: Insufficient documentation

## 2012-10-04 DIAGNOSIS — E119 Type 2 diabetes mellitus without complications: Secondary | ICD-10-CM | POA: Insufficient documentation

## 2012-10-04 DIAGNOSIS — S8990XA Unspecified injury of unspecified lower leg, initial encounter: Secondary | ICD-10-CM | POA: Insufficient documentation

## 2012-10-04 DIAGNOSIS — I10 Essential (primary) hypertension: Secondary | ICD-10-CM | POA: Insufficient documentation

## 2012-10-04 DIAGNOSIS — Z79899 Other long term (current) drug therapy: Secondary | ICD-10-CM | POA: Insufficient documentation

## 2012-10-04 LAB — TROPONIN I: Troponin I: <0.3 ng/mL (ref <0.30)

## 2012-10-04 MED ORDER — TRAMADOL HCL 50 MG PO TABS: 100.0000 mg | ORAL_TABLET | Freq: Four times a day (QID) | ORAL | Status: DC | PRN

## 2012-10-04 MED ORDER — HYDROCODONE-ACETAMINOPHEN 5-325 MG PO TABS
2.0000 | ORAL_TABLET | Freq: Once | ORAL | Status: AC
  Administered 2012-10-04: 2 via ORAL
  Filled 2012-10-04: qty 2

## 2012-10-04 NOTE — ED Provider Notes (Signed)
History     CSN: [id number]  Arrival date & time 10/04/12  [id number]   First MD Initiated Contact with Patient 10/04/12 [phone]      Chief Complaint  Patient presents with  . Fall    (Consider location/radiation/quality/duration/timing/severity/associated sxs/prior treatment) HPI  Pt presents by EMS after falling at his assisted living facility. He states he was going into the bathroom and was holding onto the wall and somehow slipped and swung around and fell landing with his chest on the shower chair. He denies hitting his head or having loss of consciousness. Also states he bruised his right knee. He fell and knocked the breath out of him and he now has pain when he breathes deeply, pleuritic chest pain, in the center of his chest.  PCP Dr. Redmond School  Past Medical History  Diagnosis Date  . Hypertension   . Diabetes mellitus   . Hypercholesterolemia   . GERD (gastroesophageal reflux disease)   . Psychosis   . Degenerative disc disease   . Degenerative disc disease   . Major depressive disorder     Past Surgical History  Procedure Date  . Eye surgery 2003    Family History  Problem Relation Age of Onset  . Colon cancer Father   . Ovarian cancer Sister     History  Substance Use Topics  . Smoking status: Former Smoker    Quit date: 09/21/1992  . Smokeless tobacco: Not on file  . Alcohol Use: No   Lives in assisted living   Review of Systems  All other systems reviewed and are negative.    Allergies  Review of patient's allergies indicates no known allergies.  Home Medications   Current Outpatient Rx  Name  Route  Sig  Dispense  Refill  . ACETAMINOPHEN 325 MG PO TABS   Oral   Take 650 mg by mouth every 6 (six) hours as needed. For pain         . ALBUTEROL SULFATE (2.5 MG/3ML) 0.083% IN NEBU   Nebulization   Take 2.5 mg by nebulization every 6 (six) hours as needed. For shortness of breath         . VITAMIN D 2000 UNITS PO TABS   Oral   Take 2,000  Units by mouth daily.         Marland Kitchen CLONAZEPAM 0.5 MG PO TABS   Oral   Take 0.5 mg by mouth 2 (two) times daily. scheduled         . CLONAZEPAM 1 MG PO TABS   Oral   Take 0.5 mg by mouth every 6 (six) hours as needed. For  anxiety         . FERROUS SULFATE 325 (65 FE) MG PO TBEC   Oral   Take 325 mg by mouth 2 (two) times daily with a meal.         . GLIPIZIDE 5 MG PO TABS   Oral   Take 5 mg by mouth daily.          Marland Kitchen HALOPERIDOL 1 MG PO TABS   Oral   Take 1 mg by mouth 2 (two) times daily.          . DEPLIN 15 MG PO TABS   Oral   Take 7.5 mg by mouth daily.         . L-METHYLFOLATE-B6-B12 3-35-2 MG PO TABS   Oral   Take 0.5 tablets by mouth daily.         Marland Kitchen  LURASIDONE HCL 40 MG PO TABS   Oral   Take 40 mg by mouth daily with breakfast. Take with latuda 20mg  to make a total dose of 60mg          . LURASIDONE HCL 20 MG PO TABS   Oral   Take 1 tablet by mouth daily. Take along with 40mg  tablet to make a total of 60mg          . MELOXICAM 15 MG PO TABS   Oral   Take 15 mg by mouth daily.         Marland Kitchen OMEPRAZOLE 20 MG PO CPDR   Oral   Take 20 mg by mouth daily.         . SERTRALINE HCL 100 MG PO TABS   Oral   Take 100 mg by mouth 2 (two) times daily.          Marland Kitchen SIMVASTATIN 10 MG PO TABS   Oral   Take 10 mg by mouth at bedtime.         . TRIHEXYPHENIDYL HCL 5 MG PO TABS   Oral   Take 5 mg by mouth at bedtime.         . TRAZODONE HCL 50 MG PO TABS   Oral   Take 50 mg by mouth at bedtime as needed. For sleep           BP 105/61  Pulse 68  Temp 97.7 F (36.5 C) (Oral)  Resp 20  SpO2 95%  Vital signs normal    Physical Exam  Nursing note and vitals reviewed. Constitutional: He is oriented to person, place, and time. He appears well-developed and well-nourished.  Non-toxic appearance. He does not appear ill. No distress.  HENT:  Head: Normocephalic and atraumatic.  Right Ear: External ear normal.  Left Ear: External ear  normal.  Nose: Nose normal. No mucosal edema or rhinorrhea.  Mouth/Throat: Oropharynx is clear and moist and mucous membranes are normal. No dental abscesses or uvula swelling.  Eyes: Conjunctivae normal and EOM are normal. Pupils are equal, round, and reactive to light.  Neck: Normal range of motion and full passive range of motion without pain. Neck supple.  Cardiovascular: Normal rate, regular rhythm and normal heart sounds.  Exam reveals no gallop and no friction rub.   No murmur heard. Pulmonary/Chest: Effort normal and breath sounds normal. No respiratory distress. He has no wheezes. He has no rhonchi. He has no rales. He exhibits tenderness. He exhibits no crepitus.         No obvious bruising/swelling seen  Abdominal: Soft. Normal appearance and bowel sounds are normal. He exhibits no distension. There is no tenderness. There is no rebound and no guarding.  Musculoskeletal: Normal range of motion. He exhibits no edema and no tenderness.       Legs:      Has a small bruise over his patellar tendon of the right knee without effusion.  Has faint bruising over the anterior prox right lower extremity.  Neurological: He is alert and oriented to person, place, and time. He has normal strength. No cranial nerve deficit.  Skin: Skin is warm, dry and intact. No rash noted. No erythema. No pallor.  Psychiatric: He has a normal mood and affect. His speech is normal and behavior is normal. His mood appears not anxious.    ED Course  Procedures (including critical care time)   Medications  HYDROcodone-acetaminophen (NORCO/VICODIN) 5-325 MG per tablet 2 tablet (2 tablet Oral  Given 10/04/12 0439)   Pt ambulated to the bathroom with assistance.   Results for orders placed during the hospital encounter of 10/04/12  TROPONIN I      Component Value Range   Troponin I <0.30  <0.30 ng/mL   Laboratory interpretation all normal  Dg Chest 2 View  10/04/2012  *RADIOLOGY REPORT*  Clinical Data:  Fall.  Mid chest pain.  CHEST - 2 VIEW  Comparison: None.  Findings: Heart is borderline in size.  No confluent airspace opacities.  No effusions or acute bony abnormality.  No pneumothorax.  IMPRESSION: No active cardiopulmonary disease.   Original Report Authenticated By: Charlett Nose, M.D.    Dg Sternum  10/04/2012  *RADIOLOGY REPORT*  Clinical Data: Fall, sternal pain.  STERNUM - 2+ VIEW  Comparison: 12/19/2010  Findings: Slight indentation of the anterior cortex in the lower sternum.  I suspect this is within normal limits, but cannot completely exclude buckling of the anterior cortex.  IMPRESSION: Slight buckle appearance of the anterior cortex as above.   Original Report Authenticated By: Charlett Nose, M.D.    Dg Tibia/fibula Right  10/04/2012  *RADIOLOGY REPORT*  Clinical Data: Fall.  RIGHT TIBIA AND FIBULA - 2 VIEW  Comparison: None  Findings: No acute bony abnormality.  Specifically, no fracture, subluxation, or dislocation.  Soft tissues are intact.  IMPRESSION: No acute bony abnormality.   Original Report Authenticated By: Charlett Nose, M.D.       Date: 10/04/2012  Rate: 64  Rhythm: normal sinus rhythm  QRS Axis: LAD  Intervals: normal  ST/T Wave abnormalities: nonspecific T wave changes  Conduction Disutrbances:none  Narrative Interpretation:   Old EKG Reviewed: unchanged from 04/22/2012   1. Fall   2. Contusion of chest   3. Contusion of knee, right   4. Contusion of lower leg, right     New Prescriptions   TRAMADOL (ULTRAM) 50 MG TABLET    Take 2 tablets (100 mg total) by mouth every 6 (six) hours as needed for pain.    Plan discharge  Devoria Albe, MD, FACEP   MDM          Ward Givens, MD 10/04/12 [phone]

## 2012-10-04 NOTE — ED Notes (Signed)
[id number]<AV> Expected date:10/04/12<BR> Expected time: 3:13 AM<BR> Means of arrival:Ambulance<BR> Comments:<BR> Fall; chest wall injury

## 2012-10-04 NOTE — ED Notes (Signed)
MD at bedside. 

## 2012-10-04 NOTE — ED Notes (Signed)
Per EMS: Pt from [facility]. Pt slipped in the shower hitting chest on shower seat. No crepitus or deformity noted. Denies LOC, head, neck or back pain. Hx of degenerative disc disease.

## 2012-10-09 ENCOUNTER — Telehealth (INDEPENDENT_AMBULATORY_CARE_PROVIDER_SITE_OTHER): Payer: Self-pay | Admitting: General Surgery

## 2012-10-09 NOTE — Telephone Encounter (Signed)
Spoke with Vikki Ports at [facility] (case manager). Explained that while we have medical clearance I need to speak with pt's next of kin because I do not believe he is able to consent for procedures. He was unable to explain to me GB surgery or list some of the potential complications. Vikki Ports gave me a relative's number Nigel Berthold [phone] - which I attempted - was disconnected. Will have my assistant contact Vikki Ports and see if she has any additional family or POA information.

## 2012-10-10 NOTE — Telephone Encounter (Signed)
Spoke with Miss [name] at [facility] and she states he is his own [place]. She states [name], his niece, is his only family and they only hear from her occasionally because she is homeless. I made her aware that Dr Andrey Campanile did not feel comfortable having the patient sign his own consent and that I would make him aware of this information.

## 2012-12-05 ENCOUNTER — Telehealth (INDEPENDENT_AMBULATORY_CARE_PROVIDER_SITE_OTHER): Payer: Self-pay | Admitting: General Surgery

## 2012-12-05 ENCOUNTER — Encounter (INDEPENDENT_AMBULATORY_CARE_PROVIDER_SITE_OTHER): Payer: Self-pay

## 2012-12-05 NOTE — Telephone Encounter (Signed)
Spoke with Mrs [name], nurse at [facility], and patient has no longer been complaining of gallbladder symptoms. They are aware if he has any ongoing symptoms to contact us and we will get him back in to see Dr Andrey Campanile and discuss gallbladder surgery.

## 2013-04-17 ENCOUNTER — Encounter (HOSPITAL_COMMUNITY): Payer: Self-pay | Admitting: Emergency Medicine

## 2013-04-17 ENCOUNTER — Emergency Department (HOSPITAL_COMMUNITY): Payer: Medicaid Other

## 2013-04-17 ENCOUNTER — Emergency Department (HOSPITAL_COMMUNITY)
Admission: EM | Admit: 2013-04-17 | Discharge: 2013-04-18 | Payer: Medicaid Other | Attending: Emergency Medicine | Admitting: Emergency Medicine

## 2013-04-17 DIAGNOSIS — K8020 Calculus of gallbladder without cholecystitis without obstruction: Secondary | ICD-10-CM | POA: Insufficient documentation

## 2013-04-17 DIAGNOSIS — E7800 Pure hypercholesterolemia, unspecified: Secondary | ICD-10-CM | POA: Insufficient documentation

## 2013-04-17 DIAGNOSIS — M519 Unspecified thoracic, thoracolumbar and lumbosacral intervertebral disc disorder: Secondary | ICD-10-CM | POA: Insufficient documentation

## 2013-04-17 DIAGNOSIS — E119 Type 2 diabetes mellitus without complications: Secondary | ICD-10-CM | POA: Insufficient documentation

## 2013-04-17 DIAGNOSIS — K219 Gastro-esophageal reflux disease without esophagitis: Secondary | ICD-10-CM | POA: Insufficient documentation

## 2013-04-17 DIAGNOSIS — I10 Essential (primary) hypertension: Secondary | ICD-10-CM | POA: Insufficient documentation

## 2013-04-17 DIAGNOSIS — F333 Major depressive disorder, recurrent, severe with psychotic symptoms: Secondary | ICD-10-CM | POA: Insufficient documentation

## 2013-04-17 DIAGNOSIS — Z87891 Personal history of nicotine dependence: Secondary | ICD-10-CM | POA: Insufficient documentation

## 2013-04-17 LAB — COMPREHENSIVE METABOLIC PANEL
ALT: 10 U/L (ref 0–53)
AST: 17 U/L (ref 0–37)
Albumin: 3.4 g/dL — ABNORMAL LOW (ref 3.5–5.2)
Alkaline Phosphatase: 55 U/L (ref 39–117)
Calcium: 9.3 mg/dL (ref 8.4–10.5)
Potassium: 4 mEq/L (ref 3.5–5.1)
Sodium: 143 mEq/L (ref 135–145)
Total Protein: 6.9 g/dL (ref 6.0–8.3)

## 2013-04-17 LAB — CBC WITH DIFFERENTIAL/PLATELET
Basophils Absolute: 0 10*3/uL (ref 0.0–0.1)
Basophils Relative: 0 % (ref 0–1)
Hemoglobin: 13.1 g/dL (ref 13.0–17.0)
MCHC: 31.8 g/dL (ref 30.0–36.0)
Monocytes Relative: 6 % (ref 3–12)
Neutro Abs: 4.3 10*3/uL (ref 1.7–7.7)
Neutrophils Relative %: 67 % (ref 43–77)
Platelets: 127 10*3/uL — ABNORMAL LOW (ref 150–400)

## 2013-04-17 LAB — SALICYLATE LEVEL: Salicylate Lvl: <2 mg/dL — ABNORMAL LOW (ref 2.8–20.0)

## 2013-04-17 LAB — ETHANOL: Alcohol, Ethyl (B): <11 mg/dL (ref 0–11)

## 2013-04-17 NOTE — ED Notes (Signed)
Called psych ED, with RNs are busy.

## 2013-04-17 NOTE — ED Notes (Signed)
Pt cannot urinate at this time

## 2013-04-17 NOTE — ED Provider Notes (Signed)
History    This chart was scribed for Roxy Horseman PA-C, a non-physician practitioner working with Gwyneth Sprout, MD by Lewanda Rife, ED Scribe. This patient was seen in room WTR6/WTR6 and the patient's care was started at 1908.     CSN: [id number]  Arrival date & time 04/17/13  4098   First MD Initiated Contact with Patient 04/17/13 1830      Chief Complaint  Patient presents with  . Altered Mental Status    (Consider location/radiation/quality/duration/timing/severity/associated sxs/prior Treatment) Level 5 caveat applies due to altered mental status.   The history is provided by the nursing home, the EMS personnel and the patient.  HPI Comments: [name] is a 57 y.o. male brought in by ambulance, who presents to the Emergency Department with altered mental status from [facility] Nursing home and EMS reports pt had his medications changed 3 weeks ago and since then has been "acting out." Nursing home reports he has been having visual hallucinations and "violent outbursts." Pt reports falling at an unknown time recently.       Past Medical History  Diagnosis Date  . Hypertension   . Diabetes mellitus   . Hypercholesterolemia   . GERD (gastroesophageal reflux disease)   . Psychosis   . Degenerative disc disease   . Degenerative disc disease   . Major depressive disorder     Past Surgical History  Procedure Laterality Date  . Eye surgery  2003    Family History  Problem Relation Age of Onset  . Colon cancer Father   . Ovarian cancer Sister     History  Substance Use Topics  . Smoking status: Former Smoker    Quit date: 09/21/1992  . Smokeless tobacco: Not on file  . Alcohol Use: No      Review of Systems  Unable to perform ROS: Mental status change   A complete 10 system review of systems was obtained and all systems are negative except as noted in the HPI and PMH.    Allergies  Review of patient's allergies indicates no known  allergies.  Home Medications   Current Outpatient Rx  Name  Route  Sig  Dispense  Refill  . acetaminophen (MAPAP) 325 MG tablet   Oral   Take 650 mg by mouth every 6 (six) hours as needed. For pain         . albuterol (PROVENTIL) (2.5 MG/3ML) 0.083% nebulizer solution   Nebulization   Take 2.5 mg by nebulization every 6 (six) hours as needed. For shortness of breath         . Cholecalciferol (VITAMIN D) 2000 UNITS tablet   Oral   Take 2,000 Units by mouth daily.         . clonazePAM (KLONOPIN) 0.5 MG tablet   Oral   Take 0.5 mg by mouth 2 (two) times daily. scheduled         . clonazePAM (KLONOPIN) 1 MG tablet   Oral   Take 0.5 mg by mouth every 6 (six) hours as needed. For  anxiety         . ferrous sulfate 325 (65 FE) MG EC tablet   Oral   Take 325 mg by mouth 2 (two) times daily with a meal.         . glipiZIDE (GLUCOTROL) 5 MG tablet   Oral   Take 5 mg by mouth daily.          . haloperidol (HALDOL) 1 MG tablet  Oral   Take 1 mg by mouth 2 (two) times daily.          Marland Kitchen L-Methylfolate (DEPLIN) 15 MG TABS   Oral   Take 7.5 mg by mouth daily.         Marland Kitchen l-methylfolate-B6-B12 (METANX) 3-35-2 MG TABS   Oral   Take 0.5 tablets by mouth daily.         Marland Kitchen lurasidone (LATUDA) 40 MG TABS   Oral   Take 40 mg by mouth daily with breakfast. Take with latuda 20mg  to make a total dose of 60mg          . Lurasidone HCl (LATUDA) 20 MG TABS   Oral   Take 1 tablet by mouth daily. Take along with 40mg  tablet to make a total of 60mg          . meloxicam (MOBIC) 15 MG tablet   Oral   Take 15 mg by mouth daily.         Marland Kitchen omeprazole (PRILOSEC) 20 MG capsule   Oral   Take 20 mg by mouth daily.         . sertraline (ZOLOFT) 100 MG tablet   Oral   Take 100 mg by mouth 2 (two) times daily.          . simvastatin (ZOCOR) 10 MG tablet   Oral   Take 10 mg by mouth at bedtime.         . traMADol (ULTRAM) 50 MG tablet   Oral   Take 2 tablets  (100 mg total) by mouth every 6 (six) hours as needed for pain.   20 tablet   0   . traZODone (DESYREL) 50 MG tablet   Oral   Take 50 mg by mouth at bedtime as needed. For sleep         . trihexyphenidyl (ARTANE) 5 MG tablet   Oral   Take 5 mg by mouth at bedtime.           BP 103/67  Pulse 85  Temp(Src) 99.4 F (37.4 C) (Oral)  Resp 16  SpO2 99%  Physical Exam  Nursing note and vitals reviewed. Constitutional: He is oriented to person, place, and time. He appears well-developed and well-nourished. No distress.  HENT:  Head: Normocephalic and atraumatic.  Eyes: EOM are normal.  Neck: Neck supple. No tracheal deviation present.  Cardiovascular: Normal rate.   Pulmonary/Chest: Effort normal. No respiratory distress.  Musculoskeletal: Normal range of motion.  Neurological: He is alert and oriented to person, place, and time.  Skin: Skin is warm and dry.  Psychiatric: He has a normal mood and affect. His behavior is normal.    ED Course  Procedures (including critical care time) Medications - No data to display  Results for orders placed during the hospital encounter of 04/17/13  CBC WITH DIFFERENTIAL      Result Value Range   WBC 6.5  4.0 - 10.5 K/uL   RBC 4.70  4.22 - 5.81 MIL/uL   Hemoglobin 13.1  13.0 - 17.0 g/dL   HCT 91.4  78.2 - 95.6 %   MCV 87.7  78.0 - 100.0 fL   MCH 27.9  26.0 - 34.0 pg   MCHC 31.8  30.0 - 36.0 g/dL   RDW 21.3  08.6 - 57.8 %   Platelets 127 (*) 150 - 400 K/uL   Neutrophils Relative % 67  43 - 77 %   Neutro Abs 4.3  1.7 - 7.7  K/uL   Lymphocytes Relative 25  12 - 46 %   Lymphs Abs 1.6  0.7 - 4.0 K/uL   Monocytes Relative 6  3 - 12 %   Monocytes Absolute 0.4  0.1 - 1.0 K/uL   Eosinophils Relative 2  0 - 5 %   Eosinophils Absolute 0.1  0.0 - 0.7 K/uL   Basophils Relative 0  0 - 1 %   Basophils Absolute 0.0  0.0 - 0.1 K/uL  URINALYSIS, ROUTINE W REFLEX MICROSCOPIC      Result Value Range   Color, Urine YELLOW  YELLOW   APPearance  CLEAR  CLEAR   Specific Gravity, Urine 1.021  1.005 - 1.030   pH 5.5  5.0 - 8.0   Glucose, UA NEGATIVE  NEGATIVE mg/dL   Hgb urine dipstick NEGATIVE  NEGATIVE   Bilirubin Urine NEGATIVE  NEGATIVE   Ketones, ur NEGATIVE  NEGATIVE mg/dL   Protein, ur NEGATIVE  NEGATIVE mg/dL   Urobilinogen, UA 1.0  0.0 - 1.0 mg/dL   Nitrite NEGATIVE  NEGATIVE   Leukocytes, UA NEGATIVE  NEGATIVE  COMPREHENSIVE METABOLIC PANEL      Result Value Range   Sodium 143  135 - 145 mEq/L   Potassium 4.0  3.5 - 5.1 mEq/L   Chloride 108  96 - 112 mEq/L   CO2 25  19 - 32 mEq/L   Glucose, Bld 99  70 - 99 mg/dL   BUN 25 (*) 6 - 23 mg/dL   Creatinine, Ser 1.61  0.50 - 1.35 mg/dL   Calcium 9.3  8.4 - 09.6 mg/dL   Total Protein 6.9  6.0 - 8.3 g/dL   Albumin 3.4 (*) 3.5 - 5.2 g/dL   AST 17  0 - 37 U/L   ALT 10  0 - 53 U/L   Alkaline Phosphatase 55  39 - 117 U/L   Total Bilirubin 0.3  0.3 - 1.2 mg/dL   GFR calc non Af Amer 82 (*) >90 mL/min   GFR calc Af Amer >90  >90 mL/min  SALICYLATE LEVEL      Result Value Range   Salicylate Lvl <2.0 (*) 2.8 - 20.0 mg/dL  ACETAMINOPHEN LEVEL      Result Value Range   Acetaminophen (Tylenol), Serum <15.0  10 - 30 ug/mL  ETHANOL      Result Value Range   Alcohol, Ethyl (B) <11  0 - 11 mg/dL  GLUCOSE, CAPILLARY      Result Value Range   Glucose-Capillary 97  70 - 99 mg/dL   Dg Chest 2 View  0/45/4098   *RADIOLOGY REPORT*  Clinical Data: Altered mental status; history of smoking.  CHEST - 2 VIEW  Comparison: Chest radiograph performed 10/04/2012  Findings: The lungs are well-aerated.  Mild chronic peribronchial thickening is noted.  There is no evidence of focal opacification, pleural effusion or pneumothorax.  The heart is borderline normal in size; the mediastinal contour is within normal limits.  No acute osseous abnormalities are seen.  IMPRESSION: Mild chronic peribronchial thickening noted; lungs otherwise clear.   Original Report Authenticated By: Tonia Ghent, M.D.    Ct Head Wo Contrast  04/17/2013   *RADIOLOGY REPORT*  Clinical Data: Altered mental status.  CT HEAD WITHOUT CONTRAST  Technique:  Contiguous axial images were obtained from the base of the skull through the vertex without contrast.  Comparison: Head CT 09/08/2012.  Findings: Again noted is a lesion in the sella turcica, compatible with known pituitary macroadenoma.  This measures up to approximately 1.7 x 1.8 cm on today's examination (image 9 of series 2).  The appearance of the brain is otherwise normal. Specifically, no definite signs of acute/subacute cerebral ischemia, no definite evidence of acute intracranial hemorrhage, no other new mass, significant mass effect, hydrocephalus or abnormal intra or extra-axial fluid collections.  Visualized paranasal sinuses and mastoids are well pneumatized  IMPRESSION: 1.  No acute intracranial abnormalities. 2.  Pituitary macroadenoma redemonstrated, as above.  This is similar to multiple prior examinations.   Original Report Authenticated By: Trudie Reed, M.D.      1. Depression, major, recurrent, severe with psychosis   2. Symptomatic cholelithiasis       MDM        I personally performed the services described in this documentation, which was scribed in my presence. The recorded information has been reviewed and is accurate.     Roxy Horseman, PA-C 05/01/13 2025

## 2013-04-17 NOTE — ED Notes (Signed)
Per EMS: Pt from [facility]. DNR. Pt had a med change 3 weeks ago.  Made scheduled Haldol to PRN.  Added abilify and latuda.  Since then, pt has been acting out, having hallucinations, and having violent outbursts.  Pt's act team was called.  Advised facility to call 911.

## 2013-04-18 ENCOUNTER — Emergency Department (HOSPITAL_COMMUNITY): Payer: Medicaid Other

## 2013-04-18 ENCOUNTER — Encounter (HOSPITAL_COMMUNITY): Payer: Self-pay | Admitting: Registered Nurse

## 2013-04-18 DIAGNOSIS — F333 Major depressive disorder, recurrent, severe with psychotic symptoms: Secondary | ICD-10-CM | POA: Diagnosis present

## 2013-04-18 LAB — GLUCOSE, CAPILLARY: Glucose-Capillary: 97 mg/dL (ref 70–99)

## 2013-04-18 LAB — URINALYSIS, ROUTINE W REFLEX MICROSCOPIC
Hgb urine dipstick: NEGATIVE
Leukocytes, UA: NEGATIVE
Nitrite: NEGATIVE
Protein, ur: NEGATIVE mg/dL
Specific Gravity, Urine: 1.021 (ref 1.005–1.030)
Urobilinogen, UA: 1 mg/dL (ref 0.0–1.0)

## 2013-04-18 MED ORDER — TRAZODONE HCL 50 MG PO TABS: 50.0000 mg | ORAL_TABLET | Freq: Every evening | ORAL | Status: DC | PRN

## 2013-04-18 MED ORDER — ONDANSETRON HCL 4 MG PO TABS: 4.0000 mg | ORAL_TABLET | Freq: Three times a day (TID) | ORAL | Status: DC | PRN

## 2013-04-18 MED ORDER — TRIHEXYPHENIDYL HCL 5 MG PO TABS
5.0000 mg | ORAL_TABLET | Freq: Every day | ORAL | Status: DC
  Filled 2013-04-18: qty 1

## 2013-04-18 MED ORDER — GLIPIZIDE 5 MG PO TABS
5.0000 mg | ORAL_TABLET | Freq: Every day | ORAL | Status: DC
  Administered 2013-04-18: 5 mg via ORAL
  Filled 2013-04-18: qty 1

## 2013-04-18 MED ORDER — LURASIDONE HCL 40 MG PO TABS
40.0000 mg | ORAL_TABLET | Freq: Every day | ORAL | Status: DC
  Administered 2013-04-18: 40 mg via ORAL
  Filled 2013-04-18: qty 1

## 2013-04-18 MED ORDER — L-METHYLFOLATE-B6-B12 3-35-2 MG PO TABS: 0.5000 | ORAL_TABLET | Freq: Every day | ORAL | Status: DC

## 2013-04-18 MED ORDER — CHLORPROMAZINE HCL 50 MG PO TABS: 100.0000 mg | ORAL_TABLET | Freq: Every day | ORAL | Status: DC

## 2013-04-18 MED ORDER — CLONAZEPAM 0.5 MG PO TABS
0.5000 mg | ORAL_TABLET | Freq: Two times a day (BID) | ORAL | Status: DC
  Administered 2013-04-18: 0.5 mg via ORAL
  Filled 2013-04-18: qty 1

## 2013-04-18 MED ORDER — HALOPERIDOL 1 MG PO TABS: 1.0000 mg | ORAL_TABLET | Freq: Four times a day (QID) | ORAL | Status: DC | PRN

## 2013-04-18 MED ORDER — SIMVASTATIN 10 MG PO TABS
10.0000 mg | ORAL_TABLET | Freq: Every day | ORAL | Status: DC
  Filled 2013-04-18: qty 1

## 2013-04-18 MED ORDER — SERTRALINE HCL 50 MG PO TABS
100.0000 mg | ORAL_TABLET | Freq: Two times a day (BID) | ORAL | Status: DC
  Administered 2013-04-18: 100 mg via ORAL
  Filled 2013-04-18: qty 2

## 2013-04-18 MED ORDER — PANTOPRAZOLE SODIUM 40 MG PO TBEC
40.0000 mg | DELAYED_RELEASE_TABLET | Freq: Every day | ORAL | Status: DC
  Administered 2013-04-18: 40 mg via ORAL
  Filled 2013-04-18: qty 1

## 2013-04-18 MED ORDER — CLONAZEPAM 0.5 MG PO TABS: 0.5000 mg | ORAL_TABLET | Freq: Four times a day (QID) | ORAL | Status: DC | PRN

## 2013-04-18 MED ORDER — IBUPROFEN 600 MG PO TABS: 600.0000 mg | ORAL_TABLET | Freq: Three times a day (TID) | ORAL | Status: DC | PRN

## 2013-04-18 MED ORDER — TRAMADOL HCL 50 MG PO TABS: 100.0000 mg | ORAL_TABLET | Freq: Four times a day (QID) | ORAL | Status: DC | PRN

## 2013-04-18 MED ORDER — FOLTANX 3-35-2 MG PO TABS: 0.5000 | ORAL_TABLET | Freq: Every day | ORAL | Status: DC

## 2013-04-18 NOTE — ED Notes (Signed)
D/C instructions, one prescription and DNR paper given to mental health tech to give to nursing home staff. Walker and belongings returned. Ambulatory, denies pain. Escorted by mental health tech to front of building.

## 2013-04-18 NOTE — ED Notes (Signed)
Charge Nurse informed that patient needed to be moved to the acute side for urine collection and possible admission.

## 2013-04-18 NOTE — ED Notes (Signed)
Attempted to assist patient to use urinal patient unable to void at present time. Respirations equal and unlabored. Skin warm and dry. No acute distress noted.

## 2013-04-18 NOTE — BHH Counselor (Signed)
Patient evaluated by Rocky Morel, NP. Patient to be discharged home per her recommendations.  Patient to be discharged back to [facility]. [facility]. Spoke to Catering manager of the facility [place] [phone] and made her aware of that patient would be discharged. Also, contacted patients outpatient provider [facility] [phone] to make staff aware that patient would be discharged from the ED today. Spoke to Carlyss, Charity fundraiser and she verified that patient is receiving ACT services with their company. Sts that the counselor from the ACT team will follow up with patient today. Staff from patient's ACT team will also pick patient up from the ER after lunch time today.

## 2013-04-18 NOTE — Consult Note (Signed)
Reason for Consult: Hallucinations, anger outburst, SI Referring Physician: EDP  [name] is an 57 y.o. male.  HPI: Patient states that he is having hallucinations or his father talking to him.  "My father tells me to kill myself.  Patient states that he hears his father mostly when he is a sleep so he is not getting much sleep.  Patient also states when he hears his fathers voice he gets angry and yells and screams, "I know I am hearing my father.  I told them I am hallucination.  I have these burst of anger when I hear my fathers voice.  I scram and holler while I'm sleep.  They come (staff) say I'm causing trouble."  Patient states that he was having hallucinations prior to the change of his medications and has informed his doctor.  There has been no improvement in the verbal hallucination.  Patient states that he does not want to kill himself and is not thinking about hurting or killing other.  Patient states that he wants to go back to the facility and see his doctor.    There is no SI/HI.  Patient has a chronic history of auditory hallucinations.  Primary physician can adjust his medications.  Will give a 5 day supply of Thorazine 100 mg QHS and patient can see his primary to have medications adjusted.  CSW spoke with patient care giver and is aware that [facility] will follow up with patient today and pick him up for services and to also have patient to see his primary to have his medications adjusted.    Past Medical History  Diagnosis Date  . Hypertension   . Diabetes mellitus   . Hypercholesterolemia   . GERD (gastroesophageal reflux disease)   . Psychosis   . Degenerative disc disease   . Degenerative disc disease   . Major depressive disorder     Past Surgical History  Procedure Laterality Date  . Eye surgery  2003    Family History  Problem Relation Age of Onset  . Colon cancer Father   . Ovarian cancer Sister     Social History:  reports that he quit smoking about  20 years ago. He does not have any smokeless tobacco history on file. He reports that he does not drink alcohol or use illicit drugs.  Allergies: No Known Allergies  Medications: I have reviewed the patient's current medications.  Results for orders placed during the hospital encounter of 04/17/13 (from the past 48 hour(s))  CBC WITH DIFFERENTIAL     Status: Abnormal   Collection Time    04/17/13  7:35 PM      Result Value Range   WBC 6.5  4.0 - 10.5 K/uL   RBC 4.70  4.22 - 5.81 MIL/uL   Hemoglobin 13.1  13.0 - 17.0 g/dL   HCT 40.9  81.1 - 91.4 %   MCV 87.7  78.0 - 100.0 fL   MCH 27.9  26.0 - 34.0 pg   MCHC 31.8  30.0 - 36.0 g/dL   RDW 78.2  95.6 - 21.3 %   Platelets 127 (*) 150 - 400 K/uL   Neutrophils Relative % 67  43 - 77 %   Neutro Abs 4.3  1.7 - 7.7 K/uL   Lymphocytes Relative 25  12 - 46 %   Lymphs Abs 1.6  0.7 - 4.0 K/uL   Monocytes Relative 6  3 - 12 %   Monocytes Absolute 0.4  0.1 -  1.0 K/uL   Eosinophils Relative 2  0 - 5 %   Eosinophils Absolute 0.1  0.0 - 0.7 K/uL   Basophils Relative 0  0 - 1 %   Basophils Absolute 0.0  0.0 - 0.1 K/uL  COMPREHENSIVE METABOLIC PANEL     Status: Abnormal   Collection Time    04/17/13  7:35 PM      Result Value Range   Sodium 143  135 - 145 mEq/L   Potassium 4.0  3.5 - 5.1 mEq/L   Chloride 108  96 - 112 mEq/L   CO2 25  19 - 32 mEq/L   Glucose, Bld 99  70 - 99 mg/dL   BUN 25 (*) 6 - 23 mg/dL   Creatinine, Ser 4.09  0.50 - 1.35 mg/dL   Calcium 9.3  8.4 - 81.1 mg/dL   Total Protein 6.9  6.0 - 8.3 g/dL   Albumin 3.4 (*) 3.5 - 5.2 g/dL   AST 17  0 - 37 U/L   ALT 10  0 - 53 U/L   Alkaline Phosphatase 55  39 - 117 U/L   Total Bilirubin 0.3  0.3 - 1.2 mg/dL   GFR calc non Af Amer 82 (*) >90 mL/min   GFR calc Af Amer >90  >90 mL/min   Comment:            The eGFR has been calculated     using the CKD EPI equation.     This calculation has not been     validated in all clinical     situations.     eGFR's persistently     <90  mL/min signify     possible Chronic Kidney Disease.  SALICYLATE LEVEL     Status: Abnormal   Collection Time    04/17/13  7:35 PM      Result Value Range   Salicylate Lvl <2.0 (*) 2.8 - 20.0 mg/dL  ACETAMINOPHEN LEVEL     Status: None   Collection Time    04/17/13  7:35 PM      Result Value Range   Acetaminophen (Tylenol), Serum <15.0  10 - 30 ug/mL   Comment:            THERAPEUTIC CONCENTRATIONS VARY     SIGNIFICANTLY. A RANGE OF 10-30     ug/mL MAY BE AN EFFECTIVE     CONCENTRATION FOR MANY PATIENTS.     HOWEVER, SOME ARE BEST TREATED     AT CONCENTRATIONS OUTSIDE THIS     RANGE.     ACETAMINOPHEN CONCENTRATIONS     >150 ug/mL AT 4 HOURS AFTER     INGESTION AND >50 ug/mL AT 12     HOURS AFTER INGESTION ARE     OFTEN ASSOCIATED WITH TOXIC     REACTIONS.  ETHANOL     Status: None   Collection Time    04/17/13  7:35 PM      Result Value Range   Alcohol, Ethyl (B) <11  0 - 11 mg/dL   Comment:            LOWEST DETECTABLE LIMIT FOR     SERUM ALCOHOL IS 11 mg/dL     FOR MEDICAL PURPOSES ONLY  URINALYSIS, ROUTINE W REFLEX MICROSCOPIC     Status: None   Collection Time    04/18/13  3:30 AM      Result Value Range   Color, Urine YELLOW  YELLOW   APPearance CLEAR  CLEAR   Specific Gravity, Urine 1.021  1.005 - 1.030   pH 5.5  5.0 - 8.0   Glucose, UA NEGATIVE  NEGATIVE mg/dL   Hgb urine dipstick NEGATIVE  NEGATIVE   Bilirubin Urine NEGATIVE  NEGATIVE   Ketones, ur NEGATIVE  NEGATIVE mg/dL   Protein, ur NEGATIVE  NEGATIVE mg/dL   Urobilinogen, UA 1.0  0.0 - 1.0 mg/dL   Nitrite NEGATIVE  NEGATIVE   Leukocytes, UA NEGATIVE  NEGATIVE   Comment: MICROSCOPIC NOT DONE ON URINES WITH NEGATIVE PROTEIN, BLOOD, LEUKOCYTES, NITRITE, OR GLUCOSE <1000 mg/dL.    Dg Chest 2 View  04/18/2013   *RADIOLOGY REPORT*  Clinical Data: Altered mental status; history of smoking.  CHEST - 2 VIEW  Comparison: Chest radiograph performed 10/04/2012  Findings: The lungs are well-aerated.  Mild  chronic peribronchial thickening is noted.  There is no evidence of focal opacification, pleural effusion or pneumothorax.  The heart is borderline normal in size; the mediastinal contour is within normal limits.  No acute osseous abnormalities are seen.  IMPRESSION: Mild chronic peribronchial thickening noted; lungs otherwise clear.   Original Report Authenticated By: Tonia Ghent, M.D.   Ct Head Wo Contrast  04/17/2013   *RADIOLOGY REPORT*  Clinical Data: Altered mental status.  CT HEAD WITHOUT CONTRAST  Technique:  Contiguous axial images were obtained from the base of the skull through the vertex without contrast.  Comparison: Head CT 09/08/2012.  Findings: Again noted is a lesion in the sella turcica, compatible with known pituitary macroadenoma.  This measures up to approximately 1.7 x 1.8 cm on today's examination (image 9 of series 2).  The appearance of the brain is otherwise normal. Specifically, no definite signs of acute/subacute cerebral ischemia, no definite evidence of acute intracranial hemorrhage, no other new mass, significant mass effect, hydrocephalus or abnormal intra or extra-axial fluid collections.  Visualized paranasal sinuses and mastoids are well pneumatized  IMPRESSION: 1.  No acute intracranial abnormalities. 2.  Pituitary macroadenoma redemonstrated, as above.  This is similar to multiple prior examinations.   Original Report Authenticated By: Trudie Reed, M.D.    Review of Systems  Constitutional: Negative.   HENT: Negative.   Eyes: Negative.   Respiratory: Negative.   Cardiovascular: Negative.   Gastrointestinal: Negative.   Genitourinary: Negative.   Musculoskeletal: Negative.   Skin: Negative.   Neurological: Negative.   Endo/Heme/Allergies: Negative.   Psychiatric/Behavioral: Positive for hallucinations ("I hear my fathers' voice"). Negative for depression (Denies) and suicidal ideas (Denies). The patient has insomnia.    Blood pressure 98/64, pulse 72,  temperature 97.6 F (36.4 C), temperature source Oral, resp. rate 26, SpO2 95.00%. Physical Exam  Constitutional: He appears well-developed.  Obese   HENT:  Head: Normocephalic and atraumatic.  Eyes: Pupils are equal, round, and reactive to light.  Neck: Normal range of motion. Neck supple.  Cardiovascular: Normal rate.   Respiratory: Effort normal.  GI: Soft.  Neurological: He is alert.  Skin: Skin is warm and dry.  Psychiatric: He is actively hallucinating. Thought content is not paranoid. He expresses no homicidal and no suicidal ideation.    Assessment/Plan:  Consulted with Dr. Elsie Saas  Discharge back to assisted living facility [facility] will see him today and pick up for services Follow up with primary for medication adjustment Thorazine 100 mg qhs  Therisa Mennella B. Lacresia Darwish FNP-BC Family Nurse Practitioner, Board Certified  Idelia Caudell 04/18/2013, 12:23 PM

## 2013-04-18 NOTE — ED Notes (Addendum)
[employer] called ,I spoke will Wyvonne Lenz and Trice update on care given phone #s are [phone] or [phone]

## 2013-04-18 NOTE — Progress Notes (Signed)
Patient not listed as having a PCP.  Upon looking at most recent Medicaid card, [facility] listed as PCP.
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
## 2013-04-18 NOTE — ED Provider Notes (Signed)
7:57 AM Filed Vitals:   04/18/13 0517  BP: 102/64  Pulse: 61  Temp: 97.9 F (36.6 C)  Resp: 18   Awaiting placement at this time.  Patient may benefit from hospitalization at [facility] for medication adjustment before returning back to the nursing home.  Patient has a history of psychosis and is on Haldol, Klonopin, trazodone, Brayton Caves, MD 04/18/13 [phone]

## 2013-04-18 NOTE — Consult Note (Signed)
Reviewed the information documented and agree with the treatment plan.  Monica Codd,JANARDHAHA R. 04/18/2013 4:00 PM

## 2013-05-01 NOTE — ED Provider Notes (Signed)
Medical screening examination/treatment/procedure(s) were performed by non-physician practitioner and as supervising physician I was immediately available for consultation/collaboration.   Gwyneth Sprout, MD 05/01/13 [phone]

## 2013-07-11 ENCOUNTER — Other Ambulatory Visit (HOSPITAL_COMMUNITY): Payer: Self-pay | Admitting: Cardiology

## 2013-07-11 DIAGNOSIS — R079 Chest pain, unspecified: Secondary | ICD-10-CM

## 2013-07-25 ENCOUNTER — Encounter (HOSPITAL_COMMUNITY)
Admission: RE | Admit: 2013-07-25 | Discharge: 2013-07-25 | Disposition: A | Discharge status: Home / Self Care | Payer: Medicaid Other | Source: Ambulatory Visit | Attending: Cardiology | Admitting: Cardiology

## 2013-07-25 ENCOUNTER — Other Ambulatory Visit: Payer: Self-pay

## 2013-07-25 DIAGNOSIS — F29 Unspecified psychosis not due to a substance or known physiological condition: Secondary | ICD-10-CM | POA: Insufficient documentation

## 2013-07-25 DIAGNOSIS — R079 Chest pain, unspecified: Secondary | ICD-10-CM | POA: Insufficient documentation

## 2013-07-25 DIAGNOSIS — M129 Arthropathy, unspecified: Secondary | ICD-10-CM | POA: Insufficient documentation

## 2013-07-25 DIAGNOSIS — F329 Major depressive disorder, single episode, unspecified: Secondary | ICD-10-CM | POA: Insufficient documentation

## 2013-07-25 DIAGNOSIS — K219 Gastro-esophageal reflux disease without esophagitis: Secondary | ICD-10-CM | POA: Insufficient documentation

## 2013-07-25 DIAGNOSIS — E119 Type 2 diabetes mellitus without complications: Secondary | ICD-10-CM | POA: Insufficient documentation

## 2013-07-25 DIAGNOSIS — I10 Essential (primary) hypertension: Secondary | ICD-10-CM | POA: Insufficient documentation

## 2013-07-25 MED ORDER — REGADENOSON 0.4 MG/5ML IV SOLN
0.4000 mg | Freq: Once | INTRAVENOUS | Status: AC
  Administered 2013-07-25: 0.4 mg via INTRAVENOUS

## 2013-07-25 MED ORDER — TECHNETIUM TC 99M SESTAMIBI GENERIC - CARDIOLITE
10.0000 | Freq: Once | INTRAVENOUS | Status: AC | PRN
  Administered 2013-07-25: 10 via INTRAVENOUS

## 2013-07-25 MED ORDER — REGADENOSON 0.4 MG/5ML IV SOLN
INTRAVENOUS | Status: AC
  Filled 2013-07-25: qty 5

## 2013-07-25 MED ORDER — TECHNETIUM TC 99M SESTAMIBI GENERIC - CARDIOLITE
30.0000 | Freq: Once | INTRAVENOUS | Status: AC | PRN
  Administered 2013-07-25: 30 via INTRAVENOUS

## 2014-06-17 ENCOUNTER — Encounter (INDEPENDENT_AMBULATORY_CARE_PROVIDER_SITE_OTHER): Payer: Self-pay | Admitting: General Surgery

## 2014-07-01 ENCOUNTER — Ambulatory Visit (INDEPENDENT_AMBULATORY_CARE_PROVIDER_SITE_OTHER): Payer: Medicaid Other | Admitting: General Surgery

## 2014-07-17 ENCOUNTER — Encounter (INDEPENDENT_AMBULATORY_CARE_PROVIDER_SITE_OTHER): Payer: Self-pay | Admitting: General Surgery

## 2014-07-17 ENCOUNTER — Ambulatory Visit (INDEPENDENT_AMBULATORY_CARE_PROVIDER_SITE_OTHER): Payer: Medicaid Other | Admitting: General Surgery

## 2014-07-17 VITALS — BP 114/70 | HR 78 | Temp 97.2°F | Ht 67.0 in | Wt 241.0 lb

## 2014-07-17 DIAGNOSIS — K8020 Calculus of gallbladder without cholecystitis without obstruction: Secondary | ICD-10-CM

## 2014-07-17 NOTE — Patient Instructions (Addendum)
   in interim, start taking miralax daily for constipation Drink 6 glasses of water daily

## 2014-07-21 NOTE — Progress Notes (Signed)
Patient ID: [name], male   DOB: [birth year], 58 y.o.   MRN: [id number]  Chief Complaint  Patient presents with  . Abdominal Pain    HPI [name] is a 58 y.o. male.   HPI 58 yo WM referred by Dr Redmond School for gallbladder disease. I initially saw the patient in October 2013 for gallstones. He lives in assisted living facility - [facility] and is accompanied by caregiver today.  He was doing well but developed some right sided pain. He descirbes it as sharp and on his right side. States pain will last for several hours and will get some nausea. Reports some constipation. He functions as his own POA and guardian and signs his own legal documents.  Past Medical History  Diagnosis Date  . Hypertension   . Diabetes mellitus   . Hypercholesterolemia   . GERD (gastroesophageal reflux disease)   . Psychosis   . Degenerative disc disease   . Degenerative disc disease   . Major depressive disorder     Past Surgical History  Procedure Laterality Date  . Eye surgery  2003    Family History  Problem Relation Age of Onset  . Colon cancer Father   . Ovarian cancer Sister     Social History History  Substance Use Topics  . Smoking status: Former Smoker    Quit date: 09/21/1992  . Smokeless tobacco: Not on file  . Alcohol Use: No    No Known Allergies  Current Outpatient Prescriptions  Medication Sig Dispense Refill  . albuterol (PROVENTIL) (2.5 MG/3ML) 0.083% nebulizer solution Take 2.5 mg by nebulization every 6 (six) hours as needed. For shortness of breath      . chlorproMAZINE (THORAZINE) 50 MG tablet Take 2 tablets (100 mg total) by mouth at bedtime.  5 tablet  0  . clonazePAM (KLONOPIN) 0.5 MG tablet Take 0.5 mg by mouth 2 (two) times daily. scheduled      . clonazePAM (KLONOPIN) 1 MG tablet Take 0.5 mg by mouth every 6 (six) hours as needed. For  anxiety      . ferrous sulfate 325 (65 FE) MG EC tablet Take 325 mg by mouth 2 (two) times daily with a meal.      . glipiZIDE  (GLUCOTROL) 5 MG tablet Take 5 mg by mouth daily.       . haloperidol (HALDOL) 1 MG tablet Take 1 mg by mouth every 6 (six) hours as needed (agitation/hallucination).       Marland Kitchen l-methylfolate-B6-B12 (METANX) 3-35-2 MG TABS Take 0.5 tablets by mouth daily.      Marland Kitchen lurasidone (LATUDA) 80 MG TABS Take 40 mg by mouth daily.      Marland Kitchen omeprazole (PRILOSEC) 20 MG capsule Take 20 mg by mouth daily.      . sertraline (ZOLOFT) 100 MG tablet Take 100 mg by mouth 2 (two) times daily.       . simvastatin (ZOCOR) 10 MG tablet Take 10 mg by mouth at bedtime.      . traMADol (ULTRAM) 50 MG tablet Take 2 tablets (100 mg total) by mouth every 6 (six) hours as needed for pain.  20 tablet  0  . traZODone (DESYREL) 50 MG tablet Take 50 mg by mouth at bedtime as needed. For sleep      . trihexyphenidyl (ARTANE) 5 MG tablet Take 5 mg by mouth at bedtime.       No current facility-administered medications for this visit.    Review of  Systems Review of Systems  Constitutional: Negative for fever, chills, appetite change and unexpected weight change.       Uses walker  HENT: Negative for congestion and trouble swallowing.   Eyes: Negative for visual disturbance.  Respiratory: Negative for chest tightness and shortness of breath.   Cardiovascular: Negative for chest pain and leg swelling.       No PND, no orthopnea, no DOE  Gastrointestinal: Positive for abdominal pain, diarrhea and constipation.       See HPI  Genitourinary: Negative for dysuria and hematuria.  Musculoskeletal: Negative.   Skin: Negative for rash.  Neurological: Negative for seizures and speech difficulty.  Hematological: Does not bruise/bleed easily.  Psychiatric/Behavioral: Negative for behavioral problems and confusion.    Blood pressure 114/70, pulse 78, temperature 97.2 F (36.2 C), height  (1.702 m), weight 241 lb (109.317 kg).  Physical Exam Physical Exam  Vitals reviewed. Constitutional: He is oriented to person, place, and  time. He appears well-developed and well-nourished. No distress.  obese  HENT:  Head: Normocephalic and atraumatic.  Right Ear: External ear normal.  Left Ear: External ear normal.  Eyes: Conjunctivae are normal. No scleral icterus.  Neck: Normal range of motion. Neck supple. No tracheal deviation present. No thyromegaly present.  Cardiovascular: Normal rate, normal heart sounds and intact distal pulses.   Pulmonary/Chest: Effort normal and breath sounds normal. No respiratory distress. He has no wheezes.  Abdominal: Soft. He exhibits no distension. There is tenderness (mild) in the right upper quadrant. There is no rebound and no guarding.  obese  Musculoskeletal: Normal range of motion. He exhibits no edema and no tenderness.  Lymphadenopathy:    He has no cervical adenopathy.  Neurological: He is alert and oriented to person, place, and time. He exhibits normal muscle tone.  ox3  Skin: Skin is warm and dry. No rash noted. He is not diaphoretic. No erythema. No pallor.  Psychiatric: He has a normal mood and affect. His behavior is normal.    Data Reviewed Ed note Dr Madilyn Fireman notes Stress test 06/2013 - no inducible ischemia, EF 66%  Assessment    Symptomatic cholelithiasis     Plan    I believe the patient's symptoms are consistent with gallbladder disease.  We discussed gallbladder disease. The patient was given Agricultural engineer. We discussed non-operative and operative management. We discussed the signs & symptoms of acute cholecystitis  I discussed laparoscopic cholecystectomy with IOC in detail.  The patient was given educational material as well as diagrams detailing the procedure.  We discussed the risks and benefits of a laparoscopic cholecystectomy including, but not limited to bleeding, infection, injury to surrounding structures such as the intestine or liver, bile leak, retained gallstones, need to convert to an open procedure, prolonged diarrhea, blood clots such as   DVT, common bile duct injury, anesthesia risks, and possible need for additional procedures.  We discussed the typical post-operative recovery course. I explained that the likelihood of improvement of their symptoms is good.  Since the patient functions as his POA and signs his legal documents we will proceed with surgery and our office will contact [facility] to coordinate surgery. His closest living relative is a niece but caregiver states they have not seen her in quite sometime.   Mary Sella. Andrey Campanile, MD, FACS General, Bariatric, & Minimally Invasive Surgery [facility]         [facility] M 07/21/2014, 6:07 PM

## 2014-07-26 ENCOUNTER — Emergency Department (HOSPITAL_COMMUNITY)
Admission: EM | Admit: 2014-07-26 | Discharge: 2014-07-26 | Disposition: A | Discharge status: Home / Self Care | Payer: Medicaid Other | Attending: Emergency Medicine | Admitting: Emergency Medicine

## 2014-07-26 ENCOUNTER — Encounter (HOSPITAL_COMMUNITY): Payer: Self-pay | Admitting: Emergency Medicine

## 2014-07-26 DIAGNOSIS — Z7982 Long term (current) use of aspirin: Secondary | ICD-10-CM | POA: Diagnosis not present

## 2014-07-26 DIAGNOSIS — I10 Essential (primary) hypertension: Secondary | ICD-10-CM | POA: Insufficient documentation

## 2014-07-26 DIAGNOSIS — E119 Type 2 diabetes mellitus without complications: Secondary | ICD-10-CM | POA: Diagnosis not present

## 2014-07-26 DIAGNOSIS — K219 Gastro-esophageal reflux disease without esophagitis: Secondary | ICD-10-CM | POA: Diagnosis not present

## 2014-07-26 DIAGNOSIS — M545 Low back pain, unspecified: Secondary | ICD-10-CM | POA: Insufficient documentation

## 2014-07-26 DIAGNOSIS — R6883 Chills (without fever): Secondary | ICD-10-CM | POA: Insufficient documentation

## 2014-07-26 DIAGNOSIS — F29 Unspecified psychosis not due to a substance or known physiological condition: Secondary | ICD-10-CM | POA: Diagnosis not present

## 2014-07-26 DIAGNOSIS — K5900 Constipation, unspecified: Secondary | ICD-10-CM | POA: Diagnosis not present

## 2014-07-26 DIAGNOSIS — Z8739 Personal history of other diseases of the musculoskeletal system and connective tissue: Secondary | ICD-10-CM | POA: Insufficient documentation

## 2014-07-26 DIAGNOSIS — Z79899 Other long term (current) drug therapy: Secondary | ICD-10-CM | POA: Diagnosis not present

## 2014-07-26 DIAGNOSIS — R110 Nausea: Secondary | ICD-10-CM | POA: Diagnosis not present

## 2014-07-26 DIAGNOSIS — E7800 Pure hypercholesterolemia, unspecified: Secondary | ICD-10-CM | POA: Diagnosis not present

## 2014-07-26 DIAGNOSIS — R1011 Right upper quadrant pain: Secondary | ICD-10-CM

## 2014-07-26 DIAGNOSIS — Z87891 Personal history of nicotine dependence: Secondary | ICD-10-CM | POA: Diagnosis not present

## 2014-07-26 DIAGNOSIS — Z8719 Personal history of other diseases of the digestive system: Secondary | ICD-10-CM

## 2014-07-26 DIAGNOSIS — F329 Major depressive disorder, single episode, unspecified: Secondary | ICD-10-CM | POA: Diagnosis not present

## 2014-07-26 LAB — CBC WITH DIFFERENTIAL/PLATELET
Basophils Absolute: 0 10*3/uL (ref 0.0–0.1)
Basophils Relative: 0 % (ref 0–1)
Eosinophils Absolute: 0.1 10*3/uL (ref 0.0–0.7)
Eosinophils Relative: 1 % (ref 0–5)
HCT: 42.6 % (ref 39.0–52.0)
Hemoglobin: 14.7 g/dL (ref 13.0–17.0)
Lymphocytes Relative: 25 % (ref 12–46)
Lymphs Abs: 1.5 10*3/uL (ref 0.7–4.0)
MCH: 31.1 pg (ref 26.0–34.0)
MCHC: 34.5 g/dL (ref 30.0–36.0)
MCV: 90.1 fL (ref 78.0–100.0)
Monocytes Absolute: 0.5 10*3/uL (ref 0.1–1.0)
Monocytes Relative: 8 % (ref 3–12)
Neutro Abs: 3.9 10*3/uL (ref 1.7–7.7)
Neutrophils Relative %: 66 % (ref 43–77)
Platelets: 124 10*3/uL — ABNORMAL LOW (ref 150–400)
RBC: 4.73 MIL/uL (ref 4.22–5.81)
RDW: 13.4 % (ref 11.5–15.5)
WBC: 5.9 10*3/uL (ref 4.0–10.5)

## 2014-07-26 LAB — URINALYSIS, ROUTINE W REFLEX MICROSCOPIC
Bilirubin Urine: NEGATIVE
Glucose, UA: NEGATIVE mg/dL
Hgb urine dipstick: NEGATIVE
Ketones, ur: NEGATIVE mg/dL
Leukocytes, UA: NEGATIVE
Nitrite: NEGATIVE
Protein, ur: NEGATIVE mg/dL
Specific Gravity, Urine: 1.019 (ref 1.005–1.030)
Urobilinogen, UA: 1 mg/dL (ref 0.0–1.0)
pH: 7 (ref 5.0–8.0)

## 2014-07-26 LAB — LIPASE, BLOOD: Lipase: 33 U/L (ref 11–59)

## 2014-07-26 LAB — COMPREHENSIVE METABOLIC PANEL
ALT: 10 U/L (ref 0–53)
AST: 20 U/L (ref 0–37)
Albumin: 3.8 g/dL (ref 3.5–5.2)
Alkaline Phosphatase: 59 U/L (ref 39–117)
Anion gap: 15 (ref 5–15)
BUN: 21 mg/dL (ref 6–23)
CO2: 25 mEq/L (ref 19–32)
Calcium: 9.3 mg/dL (ref 8.4–10.5)
Chloride: 99 mEq/L (ref 96–112)
Creatinine, Ser: 0.96 mg/dL (ref 0.50–1.35)
GFR calc Af Amer: >90 mL/min (ref >90)
GFR calc non Af Amer: 89 mL/min — ABNORMAL LOW (ref >90)
Glucose, Bld: 127 mg/dL — ABNORMAL HIGH (ref 70–99)
Potassium: 4.3 mEq/L (ref 3.7–5.3)
Sodium: 139 mEq/L (ref 137–147)
Total Bilirubin: 0.5 mg/dL (ref 0.3–1.2)
Total Protein: 7 g/dL (ref 6.0–8.3)

## 2014-07-26 MED ORDER — HYDROMORPHONE HCL PF 1 MG/ML IJ SOLN
1.0000 mg | Freq: Once | INTRAMUSCULAR | Status: AC
  Administered 2014-07-26: 1 mg via INTRAVENOUS
  Filled 2014-07-26: qty 1

## 2014-07-26 MED ORDER — PROMETHAZINE HCL 25 MG PO TABS: 25.0000 mg | ORAL_TABLET | Freq: Four times a day (QID) | ORAL | Status: DC | PRN

## 2014-07-26 MED ORDER — ONDANSETRON HCL 4 MG/2ML IJ SOLN
4.0000 mg | Freq: Once | INTRAMUSCULAR | Status: AC
  Administered 2014-07-26: 4 mg via INTRAVENOUS
  Filled 2014-07-26: qty 2

## 2014-07-26 MED ORDER — OXYCODONE-ACETAMINOPHEN 5-325 MG PO TABS: 1.0000 | ORAL_TABLET | Freq: Two times a day (BID) | ORAL | Status: DC

## 2014-07-26 MED ORDER — POLYETHYLENE GLYCOL 3350 17 GM/SCOOP PO POWD: 17.0000 g | Freq: Two times a day (BID) | ORAL | Status: DC

## 2014-07-26 NOTE — ED Notes (Addendum)
Pt provided urine sample, however, sample was less than 5ml. Explained to pt additional urine would be required. States he cannot go at this time. Urinal left with pt. Will recheck in 30 minutes

## 2014-07-26 NOTE — ED Provider Notes (Signed)
Medical screening examination/treatment/procedure(s) were performed by non-physician practitioner and as supervising physician I was immediately available for consultation/collaboration.   EKG Interpretation None      Devoria Albe, MD, Armando Gang   Ward Givens, MD 07/26/14 [phone]

## 2014-07-26 NOTE — ED Notes (Signed)
[facility] called for transport.  

## 2014-07-26 NOTE — ED Notes (Signed)
Per [facility]-RLQ pain for 2 years-issues with gallbladder-no one at facility could give information related to abdominal history

## 2014-07-26 NOTE — ED Notes (Signed)
Pt states that he has been dx with gallstones. States he is having severe pain on the RUQ that radiates to his back. Pt denies n/v.

## 2014-07-26 NOTE — ED Provider Notes (Signed)
CSN: [id number]     Arrival date & time 07/26/14  1649 History   First MD Initiated Contact with Patient 07/26/14 1659     Chief Complaint  Patient presents with  . RLQ pain      (Consider location/radiation/quality/duration/timing/severity/associated sxs/prior Treatment) HPI Pt is a 58yo male with hx of HTN, DM, hypercholesterolemia, GERD, psychosis, DDD, and MDD who lives in assisted living facility - [facility], brought to ED via [facility] for further evaluation of RLQ pain.  Per medical records, pt has had 71yrs of gallbladder issues, was evaluated by Dr. Gaynelle Adu with [facility] on 07/17/14 with symptoms related to gallbladder. Per office note, pt is in process of setting up laparoscopic surgery for a cholecystectomy. No additional notes past 8/26.  Pt states today his pain is more diffuse aching, 8/10, worse in its right flank and right mid back.  Pt also states he feels nauseated and has been constipated "for a long time"  States he is on pain medication but cannot recall name, states he is not currently on a stool softener.  Denies known fevers but does state he gets hot and cold chills. Denies known hx of renal stones. Denies dysuria or hematuria.   Past Medical History  Diagnosis Date  . Hypertension   . Diabetes mellitus   . Hypercholesterolemia   . GERD (gastroesophageal reflux disease)   . Psychosis   . Degenerative disc disease   . Degenerative disc disease   . Major depressive disorder    Past Surgical History  Procedure Laterality Date  . Eye surgery  2003   Family History  Problem Relation Age of Onset  . Colon cancer Father   . Ovarian cancer Sister    History  Substance Use Topics  . Smoking status: Former Smoker    Quit date: 09/21/1992  . Smokeless tobacco: Not on file  . Alcohol Use: No    Review of Systems  Constitutional: Positive for chills. Negative for fever and appetite change.  Respiratory: Negative for cough and shortness of  breath.   Cardiovascular: Negative for chest pain and palpitations.  Gastrointestinal: Positive for nausea, abdominal pain and constipation. Negative for vomiting and diarrhea.  Genitourinary: Positive for flank pain ( right). Negative for dysuria, urgency, hematuria, decreased urine volume, discharge, penile pain and testicular pain.  Musculoskeletal: Positive for back pain ( right sided). Negative for myalgias.  All other systems reviewed and are negative.     Allergies  Review of patient's allergies indicates no known allergies.  Home Medications   Prior to Admission medications   Medication Sig Start Date End Date Taking? Authorizing Provider  antiseptic oral rinse (BIOTENE) LIQD 15 mLs by Mouth Rinse route as needed for dry mouth.   Yes Historical Provider, MD  ARIPiprazole 300 MG SUSR Inject 300 mg into the muscle every 30 (thirty) days.   Yes Historical Provider, MD  aspirin EC 81 MG tablet Take 81 mg by mouth daily.   Yes Historical Provider, MD  clonazePAM (KLONOPIN) 0.5 MG tablet Take 0.5 mg by mouth 2 (two) times daily. scheduled   Yes Historical Provider, MD  clonazePAM (KLONOPIN) 1 MG tablet Take 0.5 mg by mouth every 6 (six) hours as needed. For  anxiety   Yes Historical Provider, MD  ferrous sulfate 325 (65 FE) MG EC tablet Take 325 mg by mouth 2 (two) times daily with a meal.   Yes Historical Provider, MD  haloperidol (HALDOL) 1 MG tablet Take 1  mg by mouth every 6 (six) hours as needed (agitation/hallucination).    Yes Historical Provider, MD  haloperidol (HALDOL) 5 MG tablet Take 5 mg by mouth at bedtime.   Yes Historical Provider, MD  l-methylfolate-B6-B12 (METANX) 3-35-2 MG TABS Take 0.5 tablets by mouth daily.   Yes Historical Provider, MD  lurasidone (LATUDA) 40 MG TABS tablet Take 40 mg by mouth daily with breakfast.   Yes Historical Provider, MD  memantine (NAMENDA) 10 MG tablet Take 10 mg by mouth 2 (two) times daily.   Yes Historical Provider, MD  omeprazole  (PRILOSEC) 20 MG capsule Take 20 mg by mouth daily.   Yes Historical Provider, MD  sertraline (ZOLOFT) 100 MG tablet Take 100 mg by mouth daily.    Yes Historical Provider, MD  simvastatin (ZOCOR) 10 MG tablet Take 10 mg by mouth at bedtime.   Yes Historical Provider, MD  trihexyphenidyl (ARTANE) 5 MG tablet Take 5 mg by mouth at bedtime.   Yes Historical Provider, MD  zolpidem (AMBIEN) 5 MG tablet Take 5 mg by mouth at bedtime as needed for sleep.   Yes Historical Provider, MD  oxyCODONE-acetaminophen (PERCOCET/ROXICET) 5-325 MG per tablet Take 1-2 tablets by mouth 2 (two) times daily. 07/26/14   Junius Finner, PA-C  polyethylene glycol powder (GLYCOLAX/MIRALAX) powder Take 17 g by mouth 2 (two) times daily. Until daily soft stools  OTC 07/26/14   Junius Finner, PA-C  promethazine (PHENERGAN) 25 MG tablet Take 1 tablet (25 mg total) by mouth every 6 (six) hours as needed for nausea or vomiting. 07/26/14   Junius Finner, PA-C   BP 96/62  Pulse 66  Temp(Src) 98.3 F (36.8 C) (Oral)  Resp 18  SpO2 93% Physical Exam  Nursing note and vitals reviewed. Constitutional: He appears well-developed and well-nourished.  Non-toxic appearing male lying comfortably in exam bed, NAD  HENT:  Head: Normocephalic and atraumatic.  Eyes: Conjunctivae are normal. No scleral icterus.  Neck: Normal range of motion.  Cardiovascular: Normal rate, regular rhythm and normal heart sounds.   Regular rate and rhythm  Pulmonary/Chest: Effort normal and breath sounds normal. No respiratory distress. He has no wheezes. He has no rales. He exhibits no tenderness.  No respiratory distress, able to speak in full sentences w/o difficulty. Lungs: CTAB  Abdominal: Soft. Bowel sounds are normal. He exhibits no distension and no mass. There is tenderness. There is no rebound and no guarding.  Soft obese abdomen, diffuse tenderness, worse in epigastrium and right flank. No rebound, guarding or masses. Right CVAT.    Musculoskeletal:  Normal range of motion.  Neurological: He is alert.  Skin: Skin is warm and dry.    ED Course  Procedures (including critical care time) Labs Review Labs Reviewed  CBC WITH DIFFERENTIAL - Abnormal; Notable for the following:    Platelets 124 (*)    All other components within normal limits  COMPREHENSIVE METABOLIC PANEL - Abnormal; Notable for the following:    Glucose, Bld 127 (*)    GFR calc non Af Amer 89 (*)    All other components within normal limits  URINE CULTURE  URINALYSIS, ROUTINE W REFLEX MICROSCOPIC  LIPASE, BLOOD    Imaging Review No results found.   EKG Interpretation None      MDM   Final diagnoses:  History of cholelithiasis  Right upper quadrant pain  Nausea  Constipation, unspecified constipation type  Right-sided low back pain without sciatica   Pt is a 58yo male followed by Dr.  Gaynelle Adu from [facility] for biliary colic due to cholelithiasis. Pt presenting today with c/o more diffuse pain and c/o constipation but states constipation has been going on "for a while" pt lives at an assisted living facility. No guardian accompanied pt, however, pt is his own POA and able to provide significant amount of medical hx. Medical records also reviewed.  Symptoms of pain and nausea sound consistent with biliary colic vs constipation. Pt is afebrile, appears well, non-toxic. abd is soft, tenderness in epigastrium and right flank w/o rebound or guarding.  Labs, including WBC and LFTs: WNL.  Not concerned for surgical abdomen including SBO, acute cholecystitis, or appendicitis. UA: unremarkable. Not concerned for pyelonephritis.  Pt was given zofran and dilaudid which did help with his pain and nausea.  Discussed pt with Dr. Devoria Albe who agreed no additional imaging indicated at this time.  18:54: Consulted with Dr. Abbey Chatters, general surgery-[facility], who advised pt needs to be on a strict low fat-no fat diet at facility to help decrease chances of worsening pain and nausea.   Dr. Abbey Chatters will notify Dr. Gaynelle Adu that pt was in ED with gallbladder attack to see if surgery can be moved up.    Home care instructions printed to be sent with pt.  RN will also call [facility] to give report including return precautions. Due to c/o constipation will discharge pt home with Rx: polyethylene glycol powder. Pt states he does feel comfortable being discharged back home to the facility.   Junius Finner, PA-C 07/26/14 1924

## 2014-07-26 NOTE — Discharge Instructions (Signed)
[name] should be on a strict low-fat to no-fat diet as indicated by general surgery to help prevent flare ups of gall-bladder pain and nausea.  See below for further instructions.

## 2014-07-26 NOTE — ED Notes (Signed)
Bed: WA20 Expected date:  Expected time:  Means of arrival:  Comments: EMS-RLQ pain

## 2014-07-27 LAB — URINE CULTURE: Colony Count: 35000

## 2014-08-27 ENCOUNTER — Encounter (HOSPITAL_COMMUNITY): Payer: Self-pay | Admitting: Pharmacy Technician

## 2014-09-03 NOTE — Patient Instructions (Addendum)
20     Your procedure is scheduled on:  Monday 09/09/2014  Report to [facility] and enter through the Emergency room and follow signs to Short Stay  at 0545 AM.  Call this number if you have problems the night before or morning of surgery:  [phone]   Remember: PER DR. Tawana ScaleWILSON'S ORDER , PATIENT TO STOP ASPIRIN, COUMADIN, PLAVIX, EFFIENT AND HERBAL MEDICATIONS 5 DAYS BEFORE SURGERY!          Do not eat food or drink liquids AFTER MIDNIGHT!  Take these medicines the morning of surgery with A SIP OF WATER: Lurasidone (Latuda), Sertraline (Zoloft), Clonazepam (Klonopin), Omeprazole (Prilosec), Memantine (Namenda)    [facility] IS NOT RESPONSIBLE FOR ANY BELONGINGS OR VALUABLES BROUGHT TO HOSPITAL.  Marland Kitchen.  Leave suitcase in the car. After surgery it may be brought to your room.  For patients admitted to the hospital, checkout time is 11:00 AM the day of              Discharge.    DO NOT WEAR JEWELRY,MAKE-UP,LOTIONS,POWDERS,PERFUMES,CONTACTS , DENTURES OR BRIDGEWORK ,AND DO NOT WEAR FALSE EYELASHES                                    Patients discharged the day of surgery will not be allowed to drive home. If going home the same day of surgery, must have someone stay with you first 24 hrs.at home and arrange for someone to drive you home from the Hospital.                         YOUR DRIVER IS: N/A   Special Instructions:              Please read over the following fact sheets that you were given:             1. [place] PREPARING FOR SURGERY SHEET              2.INCENTIVE SPIROMETRY                                                        [facility] - Preparing for Surgery Before surgery, you can play an important role.  Because skin is not sterile, your skin needs to be as free of germs as possible.  You can reduce the number of germs on your skin by washing with CHG (chlorahexidine gluconate) soap before surgery.  CHG is an antiseptic cleaner which kills germs and bonds with  the skin to continue killing germs even after washing. Please DO NOT use if you have an allergy to CHG or antibacterial soaps.  If your skin becomes reddened/irritated stop using the CHG and inform your nurse when you arrive at Short Stay. Do not shave (including legs and underarms) for at least 48 hours prior to the first CHG shower.  You may shave your face/neck. Please follow these instructions carefully:  1.  Shower with CHG Soap the night before surgery and the  morning of Surgery.  2.  If you choose to wash your hair, wash your hair first as usual with your  normal  shampoo.  3.  After you shampoo, rinse your hair  and body thoroughly to remove the  shampoo.                           4.  Use CHG as you would any other liquid soap.  You can apply chg directly  to the skin and wash                       Gently with a scrungie or clean washcloth.  5.  Apply the CHG Soap to your body ONLY FROM THE NECK DOWN.   Do not use on face/ open                           Wound or open sores. Avoid contact with eyes, ears mouth and genitals (private parts).                       Wash face,  Genitals (private parts) with your normal soap.             6.  Wash thoroughly, paying special attention to the area where your surgery  will be performed.  7.  Thoroughly rinse your body with warm water from the neck down.  8.  DO NOT shower/wash with your normal soap after using and rinsing off  the CHG Soap.                9.  Pat yourself dry with a clean towel.            10.  Wear clean pajamas.            11.  Place clean sheets on your bed the night of your first shower and do not  sleep with pets. Day of Surgery : Do not apply any lotions/deodorants the morning of surgery.  Please wear clean clothes to the hospital/surgery center.  FAILURE TO FOLLOW THESE INSTRUCTIONS MAY RESULT IN THE CANCELLATION OF YOUR SURGERY PATIENT SIGNATURE_________________________________  NURSE  SIGNATURE__________________________________  ________________________________________________________________________   Rogelia MireIncentive Spirometer  An incentive spirometer is a tool that can help keep your lungs clear and active. This tool measures how well you are filling your lungs with each breath. Taking long deep breaths may help reverse or decrease the chance of developing breathing (pulmonary) problems (especially infection) following:  A long period of time when you are unable to move or be active. BEFORE THE PROCEDURE   If the spirometer includes an indicator to show your best effort, your nurse or respiratory therapist will set it to a desired goal.  If possible, sit up straight or lean slightly forward. Try not to slouch.  Hold the incentive spirometer in an upright position. INSTRUCTIONS FOR USE  1. Sit on the edge of your bed if possible, or sit up as far as you can in bed or on a chair. 2. Hold the incentive spirometer in an upright position. 3. Breathe out normally. 4. Place the mouthpiece in your mouth and seal your lips tightly around it. 5. Breathe in slowly and as deeply as possible, raising the piston or the ball toward the top of the column. 6. Hold your breath for 3-5 seconds or for as long as possible. Allow the piston or ball to fall to the bottom of the column. 7. Remove the mouthpiece from your mouth and breathe out normally. 8. Rest for a few seconds and repeat Steps 1  through 7 at least 10 times every 1-2 hours when you are awake. Take your time and take a few normal breaths between deep breaths. 9. The spirometer may include an indicator to show your best effort. Use the indicator as a goal to work toward during each repetition. 10. After each set of 10 deep breaths, practice coughing to be sure your lungs are clear. If you have an incision (the cut made at the time of surgery), support your incision when coughing by placing a pillow or rolled up towels firmly  against it. Once you are able to get out of bed, walk around indoors and cough well. You may stop using the incentive spirometer when instructed by your caregiver.  RISKS AND COMPLICATIONS  Take your time so you do not get dizzy or light-headed.  If you are in pain, you may need to take or ask for pain medication before doing incentive spirometry. It is harder to take a deep breath if you are having pain. AFTER USE  Rest and breathe slowly and easily.  It can be helpful to keep track of a log of your progress. Your caregiver can provide you with a simple table to help with this. If you are using the spirometer at home, follow these instructions: SEEK MEDICAL CARE IF:   You are having difficultly using the spirometer.  You have trouble using the spirometer as often as instructed.  Your pain medication is not giving enough relief while using the spirometer.  You develop fever of 100.5 F (38.1 C) or higher. SEEK IMMEDIATE MEDICAL CARE IF:   You cough up bloody sputum that had not been present before.  You develop fever of 102 F (38.9 C) or greater.  You develop worsening pain at or near the incision site. MAKE SURE YOU:   Understand these instructions.  Will watch your condition.  Will get help right away if you are not doing well or get worse. Document Released: 03/21/2007 Document Revised: 01/31/2012 Document Reviewed: 05/22/2007 [facility] Patient Information 2014 [place], [place].   ________________________________________________________________________

## 2014-09-04 ENCOUNTER — Encounter (HOSPITAL_COMMUNITY)
Admission: RE | Admit: 2014-09-04 | Discharge: 2014-09-04 | Disposition: A | Discharge status: Home / Self Care | Payer: Medicaid Other | Source: Ambulatory Visit | Attending: General Surgery | Admitting: General Surgery

## 2014-09-04 ENCOUNTER — Encounter (HOSPITAL_COMMUNITY): Payer: Self-pay

## 2014-09-04 ENCOUNTER — Ambulatory Visit (HOSPITAL_COMMUNITY)
Admission: RE | Admit: 2014-09-04 | Discharge: 2014-09-04 | Disposition: A | Discharge status: Home / Self Care | Payer: Medicaid Other | Source: Ambulatory Visit | Attending: Anesthesiology | Admitting: Anesthesiology

## 2014-09-04 DIAGNOSIS — Z01818 Encounter for other preprocedural examination: Secondary | ICD-10-CM | POA: Diagnosis present

## 2014-09-04 DIAGNOSIS — I10 Essential (primary) hypertension: Secondary | ICD-10-CM | POA: Insufficient documentation

## 2014-09-04 DIAGNOSIS — K8010 Calculus of gallbladder with chronic cholecystitis without obstruction: Secondary | ICD-10-CM | POA: Insufficient documentation

## 2014-09-04 LAB — CBC WITH DIFFERENTIAL/PLATELET
BASOS ABS: 0 10*3/uL (ref 0.0–0.1)
BASOS PCT: 0 % (ref 0–1)
EOS PCT: 1 % (ref 0–5)
Eosinophils Absolute: 0.1 10*3/uL (ref 0.0–0.7)
HEMATOCRIT: 42.4 % (ref 39.0–52.0)
Hemoglobin: 14 g/dL (ref 13.0–17.0)
Lymphocytes Relative: 28 % (ref 12–46)
Lymphs Abs: 1.6 10*3/uL (ref 0.7–4.0)
MCH: 30 pg (ref 26.0–34.0)
MCHC: 33 g/dL (ref 30.0–36.0)
MCV: 91 fL (ref 78.0–100.0)
MONO ABS: 0.4 10*3/uL (ref 0.1–1.0)
Monocytes Relative: 6 % (ref 3–12)
Neutro Abs: 3.6 10*3/uL (ref 1.7–7.7)
Neutrophils Relative %: 65 % (ref 43–77)
PLATELETS: 122 10*3/uL — AB (ref 150–400)
RBC: 4.66 MIL/uL (ref 4.22–5.81)
RDW: 13.1 % (ref 11.5–15.5)
WBC: 5.7 10*3/uL (ref 4.0–10.5)

## 2014-09-04 LAB — COMPREHENSIVE METABOLIC PANEL
ALT: 10 U/L (ref 0–53)
AST: 13 U/L (ref 0–37)
Albumin: 3.7 g/dL (ref 3.5–5.2)
Alkaline Phosphatase: 56 U/L (ref 39–117)
Anion gap: 12 (ref 5–15)
BUN: 21 mg/dL (ref 6–23)
CO2: 27 mEq/L (ref 19–32)
CREATININE: 1.04 mg/dL (ref 0.50–1.35)
Calcium: 9.2 mg/dL (ref 8.4–10.5)
Chloride: 102 mEq/L (ref 96–112)
GFR calc Af Amer: 89 mL/min — ABNORMAL LOW (ref >90)
GFR calc non Af Amer: 77 mL/min — ABNORMAL LOW (ref >90)
Glucose, Bld: 108 mg/dL — ABNORMAL HIGH (ref 70–99)
Potassium: 4.1 mEq/L (ref 3.7–5.3)
SODIUM: 141 meq/L (ref 137–147)
Total Bilirubin: 0.6 mg/dL (ref 0.3–1.2)
Total Protein: 6.9 g/dL (ref 6.0–8.3)

## 2014-09-04 NOTE — Progress Notes (Signed)
09/04/14 1101  OBSTRUCTIVE SLEEP APNEA  Have you ever been diagnosed with sleep apnea through a sleep study? No  Do you snore loudly (loud enough to be heard through closed doors)?  0  Do you often feel tired, fatigued, or sleepy during the daytime? 1  Has anyone observed you stop breathing during your sleep? 0  Do you have, or are you being treated for high blood pressure? 1  BMI more than 35 kg/m2? 1  Age over 59 years old? 1  Neck circumference greater than 40 cm/16 inches? 1  Gender: 1  Obstructive Sleep Apnea Score 6  Score 4 or greater  Results sent to PCP

## 2014-09-09 ENCOUNTER — Observation Stay (HOSPITAL_COMMUNITY)
Admission: RE | Admit: 2014-09-09 | Discharge: 2014-09-10 | Disposition: A | Discharge status: Other Acute Inpatient Hospital | Payer: Medicaid Other | Source: Ambulatory Visit | Attending: General Surgery | Admitting: General Surgery

## 2014-09-09 ENCOUNTER — Ambulatory Visit (HOSPITAL_COMMUNITY): Payer: Medicaid Other

## 2014-09-09 ENCOUNTER — Encounter (HOSPITAL_COMMUNITY): Payer: Medicaid Other | Admitting: Certified Registered Nurse Anesthetist

## 2014-09-09 ENCOUNTER — Encounter (HOSPITAL_COMMUNITY): Payer: Self-pay | Admitting: *Deleted

## 2014-09-09 ENCOUNTER — Ambulatory Visit (HOSPITAL_COMMUNITY): Payer: Medicaid Other | Admitting: Certified Registered Nurse Anesthetist

## 2014-09-09 ENCOUNTER — Encounter (HOSPITAL_COMMUNITY)
Admission: RE | Disposition: A | Discharge status: Nursing Home (Includes ICF) | Payer: Self-pay | Source: Ambulatory Visit | Attending: General Surgery

## 2014-09-09 DIAGNOSIS — I10 Essential (primary) hypertension: Secondary | ICD-10-CM | POA: Diagnosis not present

## 2014-09-09 DIAGNOSIS — K219 Gastro-esophageal reflux disease without esophagitis: Secondary | ICD-10-CM | POA: Diagnosis not present

## 2014-09-09 DIAGNOSIS — K8020 Calculus of gallbladder without cholecystitis without obstruction: Secondary | ICD-10-CM | POA: Diagnosis not present

## 2014-09-09 DIAGNOSIS — F323 Major depressive disorder, single episode, severe with psychotic features: Secondary | ICD-10-CM | POA: Diagnosis not present

## 2014-09-09 DIAGNOSIS — Z7982 Long term (current) use of aspirin: Secondary | ICD-10-CM | POA: Diagnosis not present

## 2014-09-09 DIAGNOSIS — K429 Umbilical hernia without obstruction or gangrene: Secondary | ICD-10-CM | POA: Diagnosis not present

## 2014-09-09 DIAGNOSIS — F333 Major depressive disorder, recurrent, severe with psychotic symptoms: Secondary | ICD-10-CM | POA: Diagnosis present

## 2014-09-09 DIAGNOSIS — Z79899 Other long term (current) drug therapy: Secondary | ICD-10-CM | POA: Diagnosis not present

## 2014-09-09 DIAGNOSIS — Z87891 Personal history of nicotine dependence: Secondary | ICD-10-CM | POA: Insufficient documentation

## 2014-09-09 DIAGNOSIS — E119 Type 2 diabetes mellitus without complications: Secondary | ICD-10-CM | POA: Insufficient documentation

## 2014-09-09 DIAGNOSIS — E780 Pure hypercholesterolemia: Secondary | ICD-10-CM | POA: Insufficient documentation

## 2014-09-09 DIAGNOSIS — K8010 Calculus of gallbladder with chronic cholecystitis without obstruction: Secondary | ICD-10-CM | POA: Diagnosis present

## 2014-09-09 HISTORY — PX: CHOLECYSTECTOMY: SHX55

## 2014-09-09 LAB — GLUCOSE, CAPILLARY
Glucose-Capillary: 109 mg/dL — ABNORMAL HIGH (ref 70–99)
Glucose-Capillary: 149 mg/dL — ABNORMAL HIGH (ref 70–99)
Glucose-Capillary: 149 mg/dL — ABNORMAL HIGH (ref 70–99)

## 2014-09-09 SURGERY — LAPAROSCOPIC CHOLECYSTECTOMY WITH INTRAOPERATIVE CHOLANGIOGRAM
Anesthesia: General

## 2014-09-09 MED ORDER — EPHEDRINE SULFATE 50 MG/ML IJ SOLN
INTRAMUSCULAR | Status: DC | PRN
  Administered 2014-09-09 (×5): 5 mg via INTRAVENOUS

## 2014-09-09 MED ORDER — FENTANYL CITRATE 0.05 MG/ML IJ SOLN
INTRAMUSCULAR | Status: DC | PRN
  Administered 2014-09-09: 50 ug via INTRAVENOUS
  Administered 2014-09-09: 100 ug via INTRAVENOUS
  Administered 2014-09-09 (×4): 50 ug via INTRAVENOUS

## 2014-09-09 MED ORDER — CISATRACURIUM BESYLATE 20 MG/10ML IV SOLN
INTRAVENOUS | Status: AC
  Filled 2014-09-09: qty 10

## 2014-09-09 MED ORDER — ATROPINE SULFATE 0.4 MG/ML IJ SOLN
INTRAMUSCULAR | Status: AC
  Filled 2014-09-09: qty 1

## 2014-09-09 MED ORDER — FENTANYL CITRATE 0.05 MG/ML IJ SOLN
25.0000 ug | INTRAMUSCULAR | Status: DC | PRN
  Administered 2014-09-09: 50 ug via INTRAVENOUS
  Administered 2014-09-09: 25 ug via INTRAVENOUS

## 2014-09-09 MED ORDER — FENTANYL CITRATE 0.05 MG/ML IJ SOLN
INTRAMUSCULAR | Status: AC
  Filled 2014-09-09: qty 5

## 2014-09-09 MED ORDER — ENOXAPARIN SODIUM 40 MG/0.4ML ~~LOC~~ SOLN
40.0000 mg | SUBCUTANEOUS | Status: DC
  Administered 2014-09-10: 40 mg via SUBCUTANEOUS
  Filled 2014-09-09 (×2): qty 0.4

## 2014-09-09 MED ORDER — PANTOPRAZOLE SODIUM 40 MG IV SOLR
40.0000 mg | INTRAVENOUS | Status: DC
  Administered 2014-09-09: 40 mg via INTRAVENOUS
  Filled 2014-09-09 (×2): qty 40

## 2014-09-09 MED ORDER — DEXAMETHASONE SODIUM PHOSPHATE 10 MG/ML IJ SOLN
INTRAMUSCULAR | Status: AC
  Filled 2014-09-09: qty 1

## 2014-09-09 MED ORDER — GLYCOPYRROLATE 0.2 MG/ML IJ SOLN
INTRAMUSCULAR | Status: AC
  Filled 2014-09-09: qty 2

## 2014-09-09 MED ORDER — MORPHINE SULFATE 2 MG/ML IJ SOLN
1.0000 mg | INTRAMUSCULAR | Status: DC | PRN
  Administered 2014-09-09: 1 mg via INTRAVENOUS
  Filled 2014-09-09: qty 1

## 2014-09-09 MED ORDER — DEXTROSE 5 % IV SOLN
INTRAVENOUS | Status: AC
  Filled 2014-09-09: qty 2

## 2014-09-09 MED ORDER — LIDOCAINE HCL (CARDIAC) 20 MG/ML IV SOLN
INTRAVENOUS | Status: DC | PRN
  Administered 2014-09-09: 75 mg via INTRAVENOUS

## 2014-09-09 MED ORDER — PROPOFOL 10 MG/ML IV BOLUS
INTRAVENOUS | Status: AC
  Filled 2014-09-09: qty 20

## 2014-09-09 MED ORDER — ACETAMINOPHEN 10 MG/ML IV SOLN
1000.0000 mg | Freq: Once | INTRAVENOUS | Status: AC
  Administered 2014-09-09: 1000 mg via INTRAVENOUS
  Filled 2014-09-09: qty 100

## 2014-09-09 MED ORDER — SODIUM CHLORIDE 0.9 % IJ SOLN
INTRAMUSCULAR | Status: AC
  Filled 2014-09-09: qty 10

## 2014-09-09 MED ORDER — BUPIVACAINE-EPINEPHRINE 0.25% -1:200000 IJ SOLN
INTRAMUSCULAR | Status: DC | PRN
  Administered 2014-09-09: 30 mL

## 2014-09-09 MED ORDER — PROPOFOL 10 MG/ML IV BOLUS
INTRAVENOUS | Status: DC | PRN
  Administered 2014-09-09: 160 mg via INTRAVENOUS

## 2014-09-09 MED ORDER — BIOTENE DRY MOUTH MT LIQD: 15.0000 mL | OROMUCOSAL | Status: DC | PRN

## 2014-09-09 MED ORDER — SUCCINYLCHOLINE CHLORIDE 20 MG/ML IJ SOLN
INTRAMUSCULAR | Status: DC | PRN
  Administered 2014-09-09: 100 mg via INTRAVENOUS

## 2014-09-09 MED ORDER — MIDAZOLAM HCL 5 MG/5ML IJ SOLN
INTRAMUSCULAR | Status: DC | PRN
  Administered 2014-09-09 (×2): .5 mg via INTRAVENOUS

## 2014-09-09 MED ORDER — HALOPERIDOL 1 MG PO TABS
1.0000 mg | ORAL_TABLET | Freq: Four times a day (QID) | ORAL | Status: DC | PRN
  Filled 2014-09-09: qty 1

## 2014-09-09 MED ORDER — EPHEDRINE SULFATE 50 MG/ML IJ SOLN
INTRAMUSCULAR | Status: AC
  Filled 2014-09-09: qty 1

## 2014-09-09 MED ORDER — LACTATED RINGERS IV SOLN
INTRAVENOUS | Status: DC | PRN
  Administered 2014-09-09: 1000 mL

## 2014-09-09 MED ORDER — GLYCOPYRROLATE 0.2 MG/ML IJ SOLN
INTRAMUSCULAR | Status: DC | PRN
  Administered 2014-09-09: .6 mg via INTRAVENOUS
  Administered 2014-09-09: 0.1 mg via INTRAVENOUS
  Administered 2014-09-09: 0.2 mg via INTRAVENOUS
  Administered 2014-09-09: .1 mg via INTRAVENOUS

## 2014-09-09 MED ORDER — LURASIDONE HCL 80 MG PO TABS
80.0000 mg | ORAL_TABLET | Freq: Every day | ORAL | Status: DC
  Administered 2014-09-10: 80 mg via ORAL
  Filled 2014-09-09 (×2): qty 1

## 2014-09-09 MED ORDER — CISATRACURIUM BESYLATE (PF) 10 MG/5ML IV SOLN
INTRAVENOUS | Status: DC | PRN
  Administered 2014-09-09: 4 mg via INTRAVENOUS
  Administered 2014-09-09: 2 mg via INTRAVENOUS
  Administered 2014-09-09: 8 mg via INTRAVENOUS
  Administered 2014-09-09: 1 mg via INTRAVENOUS

## 2014-09-09 MED ORDER — NEOSTIGMINE METHYLSULFATE 10 MG/10ML IV SOLN
INTRAVENOUS | Status: DC | PRN
  Administered 2014-09-09: 3 mg via INTRAVENOUS

## 2014-09-09 MED ORDER — CLONAZEPAM 0.5 MG PO TABS
0.5000 mg | ORAL_TABLET | Freq: Two times a day (BID) | ORAL | Status: DC
  Administered 2014-09-09 – 2014-09-10 (×2): 0.5 mg via ORAL
  Filled 2014-09-09 (×2): qty 1

## 2014-09-09 MED ORDER — KETOROLAC TROMETHAMINE 15 MG/ML IJ SOLN
15.0000 mg | Freq: Three times a day (TID) | INTRAMUSCULAR | Status: DC
  Administered 2014-09-09 – 2014-09-10 (×3): 15 mg via INTRAVENOUS
  Filled 2014-09-09 (×6): qty 1

## 2014-09-09 MED ORDER — NEOSTIGMINE METHYLSULFATE 10 MG/10ML IV SOLN
INTRAVENOUS | Status: AC
  Filled 2014-09-09: qty 1

## 2014-09-09 MED ORDER — LACTATED RINGERS IV SOLN
INTRAVENOUS | Status: DC | PRN
  Administered 2014-09-09 (×3): via INTRAVENOUS

## 2014-09-09 MED ORDER — IOHEXOL 300 MG/ML  SOLN
INTRAMUSCULAR | Status: DC | PRN
  Administered 2014-09-09: 11 mL via INTRAVENOUS

## 2014-09-09 MED ORDER — OXYCODONE-ACETAMINOPHEN 5-325 MG PO TABS
1.0000 | ORAL_TABLET | Freq: Two times a day (BID) | ORAL | Status: DC
  Administered 2014-09-09: 1 via ORAL
  Administered 2014-09-10: 2 via ORAL
  Filled 2014-09-09: qty 2
  Filled 2014-09-09: qty 1

## 2014-09-09 MED ORDER — ONDANSETRON HCL 4 MG PO TABS: 4.0000 mg | ORAL_TABLET | Freq: Four times a day (QID) | ORAL | Status: DC | PRN

## 2014-09-09 MED ORDER — ONDANSETRON HCL 4 MG/2ML IJ SOLN: 4.0000 mg | Freq: Once | INTRAMUSCULAR | Status: DC | PRN

## 2014-09-09 MED ORDER — CEFOXITIN SODIUM 2 G IV SOLR
2.0000 g | INTRAVENOUS | Status: AC
  Administered 2014-09-09: 2 g via INTRAVENOUS

## 2014-09-09 MED ORDER — BUPIVACAINE-EPINEPHRINE (PF) 0.25% -1:200000 IJ SOLN
INTRAMUSCULAR | Status: AC
  Filled 2014-09-09: qty 30

## 2014-09-09 MED ORDER — MEMANTINE HCL 10 MG PO TABS
10.0000 mg | ORAL_TABLET | Freq: Two times a day (BID) | ORAL | Status: DC
  Administered 2014-09-09 – 2014-09-10 (×2): 10 mg via ORAL
  Filled 2014-09-09 (×3): qty 1

## 2014-09-09 MED ORDER — LIDOCAINE HCL (CARDIAC) 20 MG/ML IV SOLN
INTRAVENOUS | Status: AC
  Filled 2014-09-09: qty 5

## 2014-09-09 MED ORDER — GI COCKTAIL ~~LOC~~
30.0000 mL | Freq: Once | ORAL | Status: AC
  Administered 2014-09-09: 30 mL via ORAL
  Filled 2014-09-09: qty 30

## 2014-09-09 MED ORDER — ONDANSETRON HCL 4 MG/2ML IJ SOLN: 4.0000 mg | Freq: Four times a day (QID) | INTRAMUSCULAR | Status: DC | PRN

## 2014-09-09 MED ORDER — ONDANSETRON HCL 4 MG/2ML IJ SOLN
INTRAMUSCULAR | Status: AC
  Filled 2014-09-09: qty 2

## 2014-09-09 MED ORDER — GLYCOPYRROLATE 0.2 MG/ML IJ SOLN
INTRAMUSCULAR | Status: AC
  Filled 2014-09-09: qty 1

## 2014-09-09 MED ORDER — SERTRALINE HCL 100 MG PO TABS
100.0000 mg | ORAL_TABLET | Freq: Every morning | ORAL | Status: DC
  Administered 2014-09-10: 100 mg via ORAL
  Filled 2014-09-09: qty 1

## 2014-09-09 MED ORDER — FENTANYL CITRATE 0.05 MG/ML IJ SOLN
INTRAMUSCULAR | Status: AC
  Filled 2014-09-09: qty 2

## 2014-09-09 MED ORDER — PHENYLEPHRINE HCL 10 MG/ML IJ SOLN
INTRAMUSCULAR | Status: DC | PRN
  Administered 2014-09-09: 40 ug via INTRAVENOUS

## 2014-09-09 MED ORDER — MIDAZOLAM HCL 2 MG/2ML IJ SOLN
INTRAMUSCULAR | Status: AC
  Filled 2014-09-09: qty 2

## 2014-09-09 MED ORDER — CLONAZEPAM 0.5 MG PO TABS: 0.5000 mg | ORAL_TABLET | Freq: Four times a day (QID) | ORAL | Status: DC | PRN

## 2014-09-09 MED ORDER — ONDANSETRON HCL 4 MG/2ML IJ SOLN
INTRAMUSCULAR | Status: DC | PRN
  Administered 2014-09-09 (×2): 2 mg via INTRAVENOUS

## 2014-09-09 MED ORDER — DEXAMETHASONE SODIUM PHOSPHATE 10 MG/ML IJ SOLN
INTRAMUSCULAR | Status: DC | PRN
  Administered 2014-09-09: 10 mg via INTRAVENOUS

## 2014-09-09 MED ORDER — POTASSIUM CHLORIDE IN NACL 20-0.45 MEQ/L-% IV SOLN
INTRAVENOUS | Status: DC
  Administered 2014-09-09 – 2014-09-10 (×2): via INTRAVENOUS
  Filled 2014-09-09 (×3): qty 1000

## 2014-09-09 SURGICAL SUPPLY — 38 items
APPLIER CLIP 5 13 M/L LIGAMAX5 (MISCELLANEOUS) ×3
APPLIER CLIP ROT 10 11.4 M/L (STAPLE)
CANISTER SUCTION 2500CC (MISCELLANEOUS) ×3 IMPLANT
CHLORAPREP W/TINT 26ML (MISCELLANEOUS) ×3 IMPLANT
CLIP APPLIE 5 13 M/L LIGAMAX5 (MISCELLANEOUS) ×1 IMPLANT
CLIP APPLIE ROT 10 11.4 M/L (STAPLE) IMPLANT
COVER MAYO STAND STRL (DRAPES) IMPLANT
DECANTER SPIKE VIAL GLASS SM (MISCELLANEOUS) ×3 IMPLANT
DERMABOND ADVANCED (GAUZE/BANDAGES/DRESSINGS) ×2
DERMABOND ADVANCED .7 DNX12 (GAUZE/BANDAGES/DRESSINGS) ×1 IMPLANT
DEVICE TROCAR PUNCTURE CLOSURE (ENDOMECHANICALS) ×3 IMPLANT
DRAPE C-ARM 42X120 X-RAY (DRAPES) IMPLANT
DRAPE LAPAROSCOPIC ABDOMINAL (DRAPES) ×3 IMPLANT
ELECT REM PT RETURN 9FT ADLT (ELECTROSURGICAL) ×3
ELECTRODE REM PT RTRN 9FT ADLT (ELECTROSURGICAL) ×1 IMPLANT
GLOVE BIO SURGEON STRL SZ7.5 (GLOVE) ×3 IMPLANT
GLOVE BIOGEL M STRL SZ7.5 (GLOVE) IMPLANT
GLOVE BIOGEL PI IND STRL 7.0 (GLOVE) ×1 IMPLANT
GLOVE BIOGEL PI INDICATOR 7.0 (GLOVE) ×2
GOWN STRL REUS W/TWL XL LVL3 (GOWN DISPOSABLE) ×9 IMPLANT
KIT BASIN OR (CUSTOM PROCEDURE TRAY) ×3 IMPLANT
NS IRRIG 1000ML POUR BTL (IV SOLUTION) ×3 IMPLANT
POUCH RETRIEVAL ECOSAC 10 (ENDOMECHANICALS) ×1 IMPLANT
POUCH RETRIEVAL ECOSAC 10MM (ENDOMECHANICALS) ×2
POUCH SPECIMEN RETRIEVAL 10MM (ENDOMECHANICALS) ×3 IMPLANT
SET CHOLANGIOGRAPH MIX (MISCELLANEOUS) ×3 IMPLANT
SET IRRIG TUBING LAPAROSCOPIC (IRRIGATION / IRRIGATOR) ×3 IMPLANT
SLEEVE XCEL OPT CAN 5 100 (ENDOMECHANICALS) ×6 IMPLANT
SUT MNCRL AB 4-0 PS2 18 (SUTURE) ×6 IMPLANT
SUT VIC AB 0 UR5 27 (SUTURE) ×3 IMPLANT
SUT VICRYL 0 UR6 27IN ABS (SUTURE) ×3 IMPLANT
TOWEL OR 17X26 10 PK STRL BLUE (TOWEL DISPOSABLE) ×3 IMPLANT
TOWEL OR NON WOVEN STRL DISP B (DISPOSABLE) ×3 IMPLANT
TRAY LAPAROSCOPIC (CUSTOM PROCEDURE TRAY) ×3 IMPLANT
TROCAR BLADELESS OPT 5 100 (ENDOMECHANICALS) ×3 IMPLANT
TROCAR XCEL 12X100 BLDLESS (ENDOMECHANICALS) ×3 IMPLANT
TROCAR XCEL BLUNT TIP 100MML (ENDOMECHANICALS) ×3 IMPLANT
TROCAR XCEL NON-BLD 11X100MML (ENDOMECHANICALS) IMPLANT

## 2014-09-09 NOTE — Anesthesia Preprocedure Evaluation (Addendum)
Anesthesia Evaluation  Patient identified by MRN, date of birth, ID band Patient awake    Reviewed: Allergy & Precautions, H&P , NPO status , Patient's Chart, lab work & pertinent test results  History of Anesthesia Complications Negative for: history of anesthetic complications  Airway Mallampati: III TM Distance: >3 FB Neck ROM: Full    Dental  (+) Poor Dentition, Missing, Chipped, Dental Advisory Given,    Pulmonary former smoker,  breath sounds clear to auscultation  Pulmonary exam normal       Cardiovascular Exercise Tolerance: Good hypertension, Pt. on medications Rhythm:Regular Rate:Normal     Neuro/Psych Depression Schizophrenia negative neurological ROS     GI/Hepatic negative GI ROS, Neg liver ROS,   Endo/Other  negative endocrine ROSdiabetes, Well Controlled, Type 2, Oral Hypoglycemic Agents  Renal/GU negative Renal ROS  negative genitourinary   Musculoskeletal  (+) Arthritis -, Osteoarthritis,    Abdominal   Peds negative pediatric ROS (+)  Hematology negative hematology ROS (+)   Anesthesia Other Findings   Reproductive/Obstetrics negative OB ROS                          Anesthesia Physical Anesthesia Plan  ASA: II  Anesthesia Plan: General   Post-op Pain Management:    Induction: Intravenous  Airway Management Planned: Oral ETT  Additional Equipment:   Intra-op Plan:   Post-operative Plan: Extubation in OR  Informed Consent: I have reviewed the patients History and Physical, chart, labs and discussed the procedure including the risks, benefits and alternatives for the proposed anesthesia with the patient or authorized representative who has indicated his/her understanding and acceptance.   Dental advisory given  Plan Discussed with: CRNA  Anesthesia Plan Comments:         Anesthesia Quick Evaluation

## 2014-09-09 NOTE — Anesthesia Postprocedure Evaluation (Signed)
  Anesthesia Post-op Note  Patient: Joel SilvasEdwin Jackowski  Procedure(s) Performed: Procedure(s) (LRB): LAPAROSCOPIC CHOLECYSTECTOMY WITH INTRAOPERATIVE CHOLANGIOGRAM (N/A)  Patient Location: PACU  Anesthesia Type: General  Level of Consciousness: awake and alert   Airway and Oxygen Therapy: Patient Spontanous Breathing  Post-op Pain: mild  Post-op Assessment: Post-op Vital signs reviewed, Patient's Cardiovascular Status Stable, Respiratory Function Stable, Patent Airway and No signs of Nausea or vomiting  Last Vitals:  Filed Vitals:   09/09/14 1330  BP: 105/66  Pulse: 83  Temp: 36.7 C  Resp: 18    Post-op Vital Signs: stable   Complications: No apparent anesthesia complications

## 2014-09-09 NOTE — Anesthesia Procedure Notes (Signed)
Procedure Name: Intubation Date/Time: 09/09/2014 7:51 AM Performed by: Edison PaceGRAY, [name] Pre-anesthesia Checklist: Patient identified, Emergency Drugs available, Suction available, Patient being monitored and Timeout performed Patient Re-evaluated:Patient Re-evaluated prior to inductionOxygen Delivery Method: Circle system utilized Preoxygenation: Pre-oxygenation with 100% oxygen Intubation Type: IV induction Ventilation: Mask ventilation without difficulty Laryngoscope Size: Mac and 4 Grade View: Grade I Tube type: Oral Tube size: 7.5 mm Airway Equipment and Method: Stylet Placement Confirmation: ETT inserted through vocal cords under direct vision,  positive ETCO2 and breath sounds checked- equal and bilateral Secured at: 21 cm Tube secured with: Tape Dental Injury: Teeth and Oropharynx as per pre-operative assessment

## 2014-09-09 NOTE — Discharge Instructions (Signed)
[facility] LAPAROSCOPIC SURGERY: POST OP INSTRUCTIONS Always review your discharge instruction sheet given to you by the facility where your surgery was performed. IF YOU HAVE DISABILITY OR FAMILY LEAVE FORMS, YOU MUST BRING THEM TO THE OFFICE FOR PROCESSING.   DO NOT GIVE THEM TO YOUR DOCTOR.  1. A prescription for pain medication may be given to you upon discharge.  Take your pain medication as prescribed, if needed.  If narcotic pain medicine is not needed, then you may take acetaminophen (Tylenol) or ibuprofen (Advil) as needed. 2. Take your usually prescribed medications unless otherwise directed. 3. If you need a refill on your pain medication, please contact your pharmacy.  They will contact our office to request authorization. Prescriptions will not be filled after 5pm or on week-ends. 4. You should follow a light diet the first few days after arrival home, such as soup and crackers, etc.  Be sure to include lots of fluids daily. 5. Most patients will experience some swelling and bruising in the area of the incisions.  Ice packs will help.  Swelling and bruising can take several days to resolve.  6. It is common to experience some constipation if taking pain medication after surgery.  Increasing fluid intake and taking a stool softener (such as Colace) will usually help or prevent this problem from occurring.  A mild laxative (Milk of Magnesia or Miralax) should be taken according to package instructions if there are no bowel movements after 48 hours. 7.  If your surgeon used skin glue on the incision, you may shower in 24 hours.  The glue will flake off over the next 2-3 weeks.  Any sutures or staples will be removed at the office during your follow-up visit. 8. ACTIVITIES:  You may resume regular (light) daily activities beginning the next day--such as daily self-care, walking, climbing stairs--gradually increasing activities as tolerated.  You may have sexual  intercourse when it is comfortable.  Refrain from any heavy lifting or straining until approved by your doctor. a. You may drive when you are no longer taking prescription pain medication, you can comfortably wear a seatbelt, and you can safely maneuver your car and apply brakes. 9. You should see your doctor in the office for a follow-up appointment approximately 2-3 weeks after your surgery.  Make sure that you call for this appointment within a day or two after you arrive home to insure a convenient appointment time. 10. OTHER INSTRUCTIONS: GET UP AND WALK AT LEAST ONCE PER HOUR WHILE AWAKE   WHEN TO CALL YOUR DOCTOR: 1. Fever over 101.0 2. Inability to urinate 3. Continued bleeding from incision. 4. Increased pain, redness, or drainage from the incision. 5. Increasing abdominal pain  The clinic staff is available to answer your questions during regular business hours.  Please dont hesitate to call and ask to speak to one of the nurses for clinical concerns.  If you have a medical emergency, go to the nearest emergency room or call 911.  A surgeon from [facility] is always on call at the hospital. [address], [address], [place], [place]  [address] ? [address] [address], [place], [place]   [address] [phone] ? [phone] ? FAX [phone] Web site: [email or web address]

## 2014-09-09 NOTE — Transfer of Care (Signed)
Immediate Anesthesia Transfer of Care Note  Patient: [name]  Procedure(s) Performed: Procedure(s): LAPAROSCOPIC CHOLECYSTECTOMY WITH INTRAOPERATIVE CHOLANGIOGRAM (N/A)  Patient Location: PACU  Anesthesia Type:General  Level of Consciousness: awake, oriented, patient cooperative, lethargic and responds to stimulation  Airway & Oxygen Therapy: Patient Spontanous Breathing and Patient connected to face mask oxygen  Post-op Assessment: Report given to PACU RN, Post -op Vital signs reviewed and stable and Patient moving all extremities  Post vital signs: Reviewed and stable  Complications: No apparent anesthesia complications

## 2014-09-09 NOTE — H&P (Signed)
[name] is an 58 y.o. male.   Chief Complaint: here for surgery HPI: 58 yo WM referred by Dr Redmond Schoolripp for gallbladder disease. I initially saw the patient in October 2013 for gallstones. He lives in assisted living facility - [facility] and is accompanied by caregiver today. He was doing well but developed some right sided pain. He descirbes it as sharp and on his right side. States pain will last for several hours and will get some nausea. Reports some constipation. He functions as his own POA and guardian and signs his own legal documents.    Past Medical History  Diagnosis Date  . Hypertension   . Diabetes mellitus   . Hypercholesterolemia   . GERD (gastroesophageal reflux disease)   . Psychosis   . Degenerative disc disease   . Degenerative disc disease   . Major depressive disorder     Past Surgical History  Procedure Laterality Date  . Eye surgery  2003  . Cyst lower aboomen left      [place]    Family History  Problem Relation Age of Onset  . Colon cancer Father   . Ovarian cancer Sister    Social History:  reports that he quit smoking about 21 years ago. He does not have any smokeless tobacco history on file. He reports that he does not drink alcohol or use illicit drugs.  Allergies: No Known Allergies  Medications Prior to Admission  Medication Sig Dispense Refill  . antiseptic oral rinse (BIOTENE) LIQD 15 mLs by Mouth Rinse route as needed for dry mouth.      . ARIPiprazole 300 MG SUSR Inject 300 mg into the muscle every 30 (thirty) days.      Marland Kitchen. aspirin EC 81 MG tablet Take 81 mg by mouth daily.      . clonazePAM (KLONOPIN) 0.5 MG tablet Take 0.5 mg by mouth 2 (two) times daily. scheduled      . ferrous sulfate 325 (65 FE) MG EC tablet Take 325 mg by mouth 2 (two) times daily with a meal.      . haloperidol (HALDOL) 1 MG tablet Take 1 mg by mouth every 6 (six) hours as needed (agitation/hallucination).       Marland Kitchen. l-methylfolate-B6-B12 (METANX) 3-35-2 MG TABS  Take 0.5 tablets by mouth daily. Takes 1/2 tablet  (7.5 mg) by mouth daily      . lurasidone (LATUDA) 80 MG TABS tablet Take 80 mg by mouth daily with breakfast.      . memantine (NAMENDA) 10 MG tablet Take 10 mg by mouth 2 (two) times daily.      Marland Kitchen. omeprazole (PRILOSEC) 20 MG capsule Take 20 mg by mouth daily.      Marland Kitchen. oxyCODONE-acetaminophen (PERCOCET/ROXICET) 5-325 MG per tablet Take 1-2 tablets by mouth 2 (two) times daily.  20 tablet  0  . polyethylene glycol powder (GLYCOLAX/MIRALAX) powder Take 17 g by mouth 2 (two) times daily. Until daily soft stools  OTC  119 g  1  . sertraline (ZOLOFT) 100 MG tablet Take 100 mg by mouth every morning.       . simvastatin (ZOCOR) 20 MG tablet Take 20 mg by mouth at bedtime.      . trihexyphenidyl (ARTANE) 5 MG tablet Take 5 mg by mouth at bedtime.      Marland Kitchen. zolpidem (AMBIEN) 5 MG tablet Take 5 mg by mouth at bedtime as needed for sleep.      . clonazePAM (KLONOPIN) 1  MG tablet Take 0.5 mg by mouth every 6 (six) hours as needed. For  anxiety      . promethazine (PHENERGAN) 25 MG tablet Take 1 tablet (25 mg total) by mouth every 6 (six) hours as needed for nausea or vomiting.  12 tablet  0    Results for orders placed during the hospital encounter of 09/09/14 (from the past 48 hour(s))  GLUCOSE, CAPILLARY     Status: Abnormal   Collection Time    09/09/14  6:32 AM      Result Value Ref Range   Glucose-Capillary 109 (*) 70 - 99 mg/dL   Comment 1 Notify RN     No results found.  Review of Systems  Constitutional: Negative for weight loss.  HENT: Negative for nosebleeds.   Eyes: Negative for blurred vision.  Respiratory: Negative for shortness of breath.   Cardiovascular: Negative for chest pain, palpitations, orthopnea and PND.       Denies DOE  Gastrointestinal: Positive for abdominal pain. Negative for nausea and vomiting.  Genitourinary: Negative for dysuria and hematuria.  Musculoskeletal: Negative.   Skin: Negative for itching and rash.   Neurological: Negative for dizziness, focal weakness, seizures, loss of consciousness and headaches.       Denies TIAs, amaurosis fugax  Endo/Heme/Allergies: Does not bruise/bleed easily.  Psychiatric/Behavioral: The patient is not nervous/anxious.     Blood pressure 113/74, pulse 84, temperature 98.9 F (37.2 C), temperature source Oral, resp. rate 16, height 5\' 6"  (1.676 m), weight 245 lb 9.6 oz (111.403 kg), SpO2 98.00%. Physical Exam  Vitals reviewed. Constitutional: He appears well-developed and well-nourished. No distress.  Morbidly obese  HENT:  Head: Normocephalic and atraumatic.  Right Ear: External ear normal.  Left Ear: External ear normal.  Eyes: Conjunctivae are normal. No scleral icterus.  Neck: Normal range of motion. Neck supple. No tracheal deviation present. No thyromegaly present.  Cardiovascular: Normal rate and normal heart sounds.   Respiratory: Effort normal and breath sounds normal. No stridor. No respiratory distress. He has no wheezes.  GI: Soft. He exhibits no distension. There is no tenderness. There is no rebound.  Musculoskeletal: He exhibits no edema and no tenderness.  Lymphadenopathy:    He has no cervical adenopathy.  Neurological: He is alert. He exhibits normal muscle tone.  Skin: Skin is warm and dry. No rash noted. He is not diaphoretic. No erythema. No pallor.  Psychiatric: He has a normal mood and affect. His behavior is normal.     Assessment/Plan Symptomatic cholelithiasis Depressive disorder HTN DM2 GERD  To OR for lap chole All questions asked and answered.    Mary SellaEric M. Andrey CampanileWilson, MD, FACS General, Bariatric, & Minimally Invasive Surgery [facility], [place]   [facility] Hospital[facility]ILSON,Smera Guyette M 09/09/2014, 7:23 AM

## 2014-09-09 NOTE — Progress Notes (Signed)
Patient complained of progressive chest pain. VS taken, Notified MD orders obtained. EKG obtained. Will continue to monitor.

## 2014-09-09 NOTE — Op Note (Signed)
[name] [id number] 04/07/1956 09/09/2014  Laparoscopic Cholecystectomy with IOC Procedure Note  Indications: This patient presents with symptomatic gallbladder disease and will undergo laparoscopic cholecystectomy.  Pre-operative Diagnosis: Symptomatic cholelithiasis, morbid obesity, DM 2, major depressive disorder, h/o psychosis  Post-operative Diagnosis: Chronic cholecystitis with cholelithiasis; umbilical hernia, same  Surgeon: Atilano InaWILSON,Malillany Kazlauskas M   Assistants: None  Anesthesia: General endotracheal anesthesia  ASA Class: 2  Procedure Details  The patient was seen again in the Holding Room. The risks, benefits, complications, treatment options, and expected outcomes were discussed with the patient. The possibilities of reaction to medication, pulmonary aspiration, perforation of viscus, bleeding, recurrent infection, finding a normal gallbladder, the need for additional procedures, failure to diagnose a condition, the possible need to convert to an open procedure, and creating a complication requiring transfusion or operation were discussed with the patient. The likelihood of improving the patient's symptoms with return to their baseline status is good.  The patient and/or family concurred with the proposed plan, giving informed consent. The site of surgery properly noted. The patient was taken to Operating Room, identified as [name] and the procedure verified as Laparoscopic Cholecystectomy with Intraoperative Cholangiogram. A Time Out was held and the above information confirmed. Antibiotic prophylaxis was administered.   Prior to the induction of general anesthesia, antibiotic prophylaxis was administered. General endotracheal anesthesia was then administered and tolerated well. After the induction, the abdomen was prepped with Chloraprep and draped in the sterile fashion. The patient was positioned in the supine position.  Because of his morbid obesity, I elected to gain access to  the abdomen in the left upper quadrant just underneath the xiphoid process using the Optiview technique. A small 1 cm incision was made. Then using a 0 5 mm laparoscope through a 5 mm trocar I advanced it for all layers of the abdominal wall and entered the abdominal cavity. Pneumoperitoneum was then created with CO2 and tolerated well without any adverse changes in the patient's vital signs. We then positioned the patient in reverse Trendelenburg and tilted slightly to the patient's left. The laparoscope had been inserted in the abdominal cavity was inspected. The gallbladder was very large and distended. He had a fatty appearing liver. He had an umbilical hernia without any contents. Because of the large size of his torso I elected to place a 12 mm trocar several inches above the umbilicus so that we can have adequate visualization of the right upper quadrant. Local anesthetic agent was injected into the skin several inches above umbilicus and an incision made. A 12 mm trocar was placed.   Two 5-mm ports were placed in the right upper quadrant. All skin incisions were infiltrated with a local anesthetic agent before making the incision and placing the trocars.   The gallbladder was identified. As mentioned above it is very distended and quite large. In order to aid with retraction I aspirated the gallbladder and was able to drain some of the bile out of it.  the fundus was grasped and retracted cephalad. Adhesions were lysed bluntly and with the electrocautery where indicated, taking care not to injure any adjacent organs or viscus. The infundibulum was grasped and retracted laterally, exposing the peritoneum overlying the triangle of Calot. This was then divided and exposed in a blunt fashion. A critical view of the cystic duct and cystic artery was obtained.  The cystic duct was clearly identified and bluntly dissected circumferentially. The cystic duct was ligated with a clip distally.   An incision was made  in the cystic duct and the [facility] catheter introduced. The catheter was secured using a clip. A cholangiogram was then obtained which showed good visualization of the distal and proximal biliary tree with no sign of filling defects or obstruction.  Contrast flowed easily into the duodenum. The catheter was then removed.   The cystic duct was then ligated with clips and divided. The cystic artery (both an anterior and posterior branch) which had been identified and dissected free was ligated with clips and divided as well.   The gallbladder was dissected from the liver bed in retrograde fashion with the electrocautery. The gallbladder was removed and placed in an Endocatch sac.  The gallbladder and Endocatch sac were then removed through the upper midline port site after enlarging the fascial incision. I actually had to open up the gallbladder and manually extract numerous stones in order to aid with extraction of the gallbladder. He had over a cup and a half of small gallstones within his gallbladder. I was able to finally extract the bag and the gallbladder in its entirety. Occult on had enlarge the fascial incision I ended up placing 2 interrupted 0 Vicryl sutures and tying those down. I then placed a pursestring suture with 0 Vicryl around the remaining fascial opening and replace the [facility] trocar. Pneumoperitoneum was reestablished. The liver bed was irrigated and inspected. Hemostasis was achieved with the electrocautery. Copious irrigation was utilized and was repeatedly aspirated until clear.  The pursestring suture was used to close the umbilical fascia.  I ended up placing an additional 0 Vicryl at the supraumbilical fascial incision.  We again inspected the right upper quadrant for hemostasis.  The umbilical closure was inspected and there was no air leak and nothing trapped within the closure. Pneumoperitoneum was released as we removed the trocars.  4-0 Monocryl was used to close the  skin.   Dermabond was applied. The patient was then extubated and brought to the recovery room in stable condition. Instrument, sponge, and needle counts were correct at closure and at the conclusion of the case.   Findings: Chronic Cholecystitis with Cholelithiasis; About a 2 cm umbilical hernia which was left alone  Estimated Blood Loss: Minimal         Drains: None         Specimens: Gallbladder           Complications: None; patient tolerated the procedure well.         Disposition: PACU - hemodynamically stable.         Condition: stable  Mary SellaEric M. Andrey CampanileWilson, MD, FACS General, Bariatric, & Minimally Invasive Surgery [facility], [place]

## 2014-09-10 ENCOUNTER — Encounter (HOSPITAL_COMMUNITY): Payer: Self-pay | Admitting: General Surgery

## 2014-09-10 DIAGNOSIS — I10 Essential (primary) hypertension: Secondary | ICD-10-CM | POA: Diagnosis present

## 2014-09-10 DIAGNOSIS — E119 Type 2 diabetes mellitus without complications: Secondary | ICD-10-CM

## 2014-09-10 DIAGNOSIS — K8020 Calculus of gallbladder without cholecystitis without obstruction: Secondary | ICD-10-CM | POA: Diagnosis not present

## 2014-09-10 MED ORDER — OXYCODONE-ACETAMINOPHEN 5-325 MG PO TABS: 1.0000 | ORAL_TABLET | Freq: Two times a day (BID) | ORAL | Status: DC

## 2014-09-10 NOTE — Discharge Summary (Signed)
Physician Discharge Summary  [name] ZOX:[id number] DOB: 08/29/1956 DOA: 09/09/2014  PCP: Robynn PaneHARWANI,MOHAN N, MD  Admit date: 09/09/2014 Discharge date: 09/10/2014  Recommendations for Outpatient Follow-up:  1.   Follow-up Information   Follow up with Atilano InaWILSON,Legend Pecore M, MD. Schedule an appointment as soon as possible for a visit in 3 weeks. (For wound re-check)    Specialty:  General Surgery   Contact information:   [address] [place] [address] [phone]       Follow up with Robynn PaneHARWANI,MOHAN N, MD. Schedule an appointment as soon as possible for a visit in 2 weeks.   Specialty:  Cardiology   Contact information:   [address] [place] [address] [phone]      Discharge Diagnoses:  Active Problems:   Depression, major, recurrent, severe with psychosis   Chronic cholecystitis with calculus   HTN (hypertension)   Diabetes mellitus  Surgical Procedure: [facility]  Discharge Condition: good Disposition: assisted living  Diet recommendation: diabetic  Filed Weights   09/09/14 0615  Weight: 245 lb 9.6 oz (111.403 kg)    Hospital Course:  Patient was kept overnight for observation after planned laparoscopic cholecystectomy with cholangiogram for chronic cholecystitis with cholelithiasis. He did well after surgery. On day of discharge, he was tolerating a diet. His pain was well-controlled. His vital signs were stable. He was ambulating without difficulty. His incisions were clean dry and intact. He was deemed stable for discharge. We have discussed discharge instructions.  BP 92/51  Pulse 67  Temp(Src) 98.8 F (37.1 C) (Oral)  Resp 15  Ht 5\' 6"  (1.676 m)  Wt 245 lb 9.6 oz (111.403 kg)  BMI 39.66 kg/m2  SpO2 98%  Gen: alert, NAD, non-toxic appearing Pupils: equal, no scleral icterus Pulm: Lungs clear to auscultation, symmetric chest rise CV: regular rate and rhythm Abd: soft, nontender,  nondistended. No cellulitis. No incisional hernia Ext: no edema, no calf tenderness Skin: no rash, no jaundice    Discharge Instructions      Discharge Instructions   Call MD for:  persistant dizziness or light-headedness    Complete by:  As directed      Call MD for:  persistant nausea and vomiting    Complete by:  As directed      Call MD for:  redness, tenderness, or signs of infection (pain, swelling, redness, odor or green/yellow discharge around incision site)    Complete by:  As directed      Call MD for:  severe uncontrolled pain    Complete by:  As directed      Diet Carb Modified    Complete by:  As directed      Discharge instructions    Complete by:  As directed   See [facility] discharge instructions; patient to get up and walk around at least once per hour while awake     Increase activity slowly    Complete by:  As directed      Lifting restrictions    Complete by:  As directed   Nothing greater than 10 pounds for 2 weeks            Medication List         antiseptic oral rinse Liqd  15 mLs by Mouth Rinse route as needed for dry mouth.     ARIPiprazole 300 MG Susr  Inject 300 mg into the muscle every 30 (thirty) days.     aspirin  EC 81 MG tablet  Take 81 mg by mouth daily.     clonazePAM 0.5 MG tablet  Commonly known as:  KLONOPIN  Take 0.5 mg by mouth 2 (two) times daily. scheduled     clonazePAM 1 MG tablet  Commonly known as:  KLONOPIN  Take 0.5 mg by mouth every 6 (six) hours as needed. For  anxiety     ferrous sulfate 325 (65 FE) MG EC tablet  Take 325 mg by mouth 2 (two) times daily with a meal.     haloperidol 1 MG tablet  Commonly known as:  HALDOL  Take 1 mg by mouth every 6 (six) hours as needed (agitation/hallucination).     l-methylfolate-B6-B12 3-35-2 MG Tabs  Commonly known as:  METANX  Take 0.5 tablets by mouth daily. Takes 1/2 tablet  (7.5 mg) by mouth daily     lurasidone 80 MG Tabs tablet  Commonly known as:  LATUDA  Take 80  mg by mouth daily with breakfast.     memantine 10 MG tablet  Commonly known as:  NAMENDA  Take 10 mg by mouth 2 (two) times daily.     omeprazole 20 MG capsule  Commonly known as:  PRILOSEC  Take 20 mg by mouth daily.     oxyCODONE-acetaminophen 5-325 MG per tablet  Commonly known as:  PERCOCET/ROXICET  Take 1-2 tablets by mouth 2 (two) times daily.     polyethylene glycol powder powder  Commonly known as:  GLYCOLAX/MIRALAX  - Take 17 g by mouth 2 (two) times daily. Until daily soft stools  -   - OTC     promethazine 25 MG tablet  Commonly known as:  PHENERGAN  Take 1 tablet (25 mg total) by mouth every 6 (six) hours as needed for nausea or vomiting.     sertraline 100 MG tablet  Commonly known as:  ZOLOFT  Take 100 mg by mouth every morning.     simvastatin 20 MG tablet  Commonly known as:  ZOCOR  Take 20 mg by mouth at bedtime.     trihexyphenidyl 5 MG tablet  Commonly known as:  ARTANE  Take 5 mg by mouth at bedtime.     zolpidem 5 MG tablet  Commonly known as:  AMBIEN  Take 5 mg by mouth at bedtime as needed for sleep.       Follow-up Information   Follow up with Atilano Ina, MD. Schedule an appointment as soon as possible for a visit in 3 weeks. (For wound re-check)    Specialty:  General Surgery   Contact information:   [address] [place] [address] [phone]       Follow up with Robynn Pane, MD. Schedule an appointment as soon as possible for a visit in 2 weeks.   Specialty:  Cardiology   Contact information:   [address] [place] [address] [phone]        The results of significant diagnostics from this hospitalization (including imaging, microbiology, ancillary and laboratory) are listed below for reference.    Significant Diagnostic Studies: Dg Chest 2 View  09/04/2014   CLINICAL DATA:  Hypertension, preop for laparoscopic cholecystectomy.  EXAM: CHEST  2 VIEW  COMPARISON:  Apr 18, 2013.  FINDINGS: The heart size and mediastinal contours are within normal limits. Both lungs are clear. The visualized skeletal structures are unremarkable.  IMPRESSION: No acute cardiopulmonary abnormality seen.   Electronically Signed   By:  Roque LiasJames  Green M.D.   On: 09/04/2014 13:27   Dg Cholangiogram Operative  09/09/2014   CLINICAL DATA:  Right upper quadrant abdominal pain. Laparoscopic cholecystectomy.  EXAM: INTRAOPERATIVE CHOLANGIOGRAM  FLUOROSCOPY TIME:  18 seconds  COMPARISON:  Abdominal ultrasound - 07/04/2012  FINDINGS: Intraoperative angiographic images of the right upper abdominal quadrant during laparoscopic cholecystectomy are provided for review.  Surgical clips overlie the expected location of the gallbladder fossa.  Contrast injection demonstrates selective cannulation of the central aspect of the cystic duct.  There is passage of contrast through the central aspect of the cystic duct with filling of a non dilated common bile duct. There is passage of contrast though the CBD and into the descending portion of the duodenum.  There is minimal reflux of injected contrast into the common hepatic duct and central aspect of the non dilated intrahepatic biliary system.  There are no discrete filling defects within the opacified portions of the biliary system to suggest the presence of choledocholithiasis.  IMPRESSION: No evidence of choledocholithiasis.   Electronically Signed   By: Simonne ComeJohn  Watts M.D.   On: 09/09/2014 12:55    Microbiology: No results found for this or any previous visit (from the past 240 hour(s)).   Labs: Basic Metabolic Panel:  Recent Labs Lab 09/04/14 1020  NA 141  K 4.1  CL 102  CO2 27  GLUCOSE 108*  BUN 21  CREATININE 1.04  CALCIUM 9.2   Liver Function Tests:  Recent Labs Lab 09/04/14 1020  AST 13  ALT 10  ALKPHOS 56  BILITOT 0.6  PROT 6.9  ALBUMIN 3.7   No results found for this basename: LIPASE, AMYLASE,  in the last 168 hours No results found  for this basename: AMMONIA,  in the last 168 hours CBC:  Recent Labs Lab 09/04/14 1020  WBC 5.7  NEUTROABS 3.6  HGB 14.0  HCT 42.4  MCV 91.0  PLT 122*   Cardiac Enzymes: No results found for this basename: CKTOTAL, CKMB, CKMBINDEX, TROPONINI,  in the last 168 hours BNP: BNP (last 3 results) No results found for this basename: PROBNP,  in the last 8760 hours CBG:  Recent Labs Lab 09/09/14 0632 09/09/14 1038 09/09/14 1957  GLUCAP 109* 149* 149*    Active Problems:   Depression, major, recurrent, severe with psychosis   Chronic cholecystitis with calculus   HTN (hypertension)   Diabetes mellitus   Time coordinating discharge: 15 minutes  Signed:  Atilano InaEric M Waylin Dorko, MD [facility], [place] [phone] 09/10/2014, 10:28 AM

## 2014-09-10 NOTE — Progress Notes (Signed)
UR completed 

## 2014-10-05 ENCOUNTER — Emergency Department (HOSPITAL_COMMUNITY): Payer: Medicaid Other

## 2014-10-05 ENCOUNTER — Encounter (HOSPITAL_COMMUNITY): Payer: Self-pay | Admitting: *Deleted

## 2014-10-05 ENCOUNTER — Emergency Department (HOSPITAL_COMMUNITY)
Admission: EM | Admit: 2014-10-05 | Discharge: 2014-10-06 | Disposition: A | Discharge status: Home / Self Care | Payer: Medicaid Other | Attending: Emergency Medicine | Admitting: Emergency Medicine

## 2014-10-05 DIAGNOSIS — Z87891 Personal history of nicotine dependence: Secondary | ICD-10-CM | POA: Diagnosis not present

## 2014-10-05 DIAGNOSIS — F29 Unspecified psychosis not due to a substance or known physiological condition: Secondary | ICD-10-CM | POA: Insufficient documentation

## 2014-10-05 DIAGNOSIS — R1032 Left lower quadrant pain: Secondary | ICD-10-CM | POA: Diagnosis present

## 2014-10-05 DIAGNOSIS — E119 Type 2 diabetes mellitus without complications: Secondary | ICD-10-CM | POA: Insufficient documentation

## 2014-10-05 DIAGNOSIS — Z79899 Other long term (current) drug therapy: Secondary | ICD-10-CM | POA: Insufficient documentation

## 2014-10-05 DIAGNOSIS — E780 Pure hypercholesterolemia: Secondary | ICD-10-CM | POA: Insufficient documentation

## 2014-10-05 DIAGNOSIS — F329 Major depressive disorder, single episode, unspecified: Secondary | ICD-10-CM | POA: Diagnosis not present

## 2014-10-05 DIAGNOSIS — Z7982 Long term (current) use of aspirin: Secondary | ICD-10-CM | POA: Diagnosis not present

## 2014-10-05 DIAGNOSIS — E669 Obesity, unspecified: Secondary | ICD-10-CM | POA: Insufficient documentation

## 2014-10-05 DIAGNOSIS — K219 Gastro-esophageal reflux disease without esophagitis: Secondary | ICD-10-CM | POA: Diagnosis not present

## 2014-10-05 DIAGNOSIS — I10 Essential (primary) hypertension: Secondary | ICD-10-CM | POA: Diagnosis not present

## 2014-10-05 DIAGNOSIS — K5900 Constipation, unspecified: Secondary | ICD-10-CM | POA: Diagnosis not present

## 2014-10-05 NOTE — ED Notes (Signed)
[facility] and he is here for abdominal pain and constipation,  No bowel movement for a week and nursing home tried to disimpact manually

## 2014-10-05 NOTE — ED Provider Notes (Signed)
CSN: [id number]     Arrival date & time 10/05/14  2301 History   First MD Initiated Contact with Patient 10/05/14 2311     Chief Complaint  Patient presents with  . Abdominal Pain  . Fecal Impaction     (Consider location/radiation/quality/duration/timing/severity/associated sxs/prior Treatment) HPI  [name] is a 58 y.o. male with past medical history of hypertension, diabetes, hyperlipidemia, GERD coming in with constipation.  He states he's not on any medications for this at his nursing facility (however upon chart review shows he takes Miralax). He has not had a bowel movement last 2 weeks. They tended to disimpact him but only returned a small amount of stool. He continues left lower quadrant pressure without any radiation. Nothing has made his symptoms better. He denies any vomiting, fevers. Nothing has made his symptoms better or worse during the interval. He states this does occur to him every so often it is normally treated with a disimpaction.  He denies any urinary symptoms. Patient has no further complaints.  10 Systems reviewed and are negative for acute change except as noted in the HPI.    Past Medical History  Diagnosis Date  . Hypertension   . Diabetes mellitus   . Hypercholesterolemia   . GERD (gastroesophageal reflux disease)   . Psychosis   . Degenerative disc disease   . Degenerative disc disease   . Major depressive disorder    Past Surgical History  Procedure Laterality Date  . Eye surgery  2003  . Cyst [place] aboomen [place]      [place]  . Cholecystectomy N/A 09/09/2014    Procedure: LAPAROSCOPIC CHOLECYSTECTOMY WITH INTRAOPERATIVE CHOLANGIOGRAM;  Surgeon: Atilano InaEric M Wilson, MD;  Location: [facility];  Service: General;  Laterality: N/A;   Family History  Problem Relation Age of Onset  . Colon cancer Father   . Ovarian cancer Sister    History  Substance Use Topics  . Smoking status: Former Smoker    Quit date: 09/21/1992  . Smokeless tobacco: Not on  file  . Alcohol Use: No    Review of Systems    Allergies  Review of patient's allergies indicates no known allergies.  Home Medications   Prior to Admission medications   Medication Sig Start Date End Date Taking? Authorizing Provider  antiseptic oral rinse (BIOTENE) LIQD 15 mLs by Mouth Rinse route as needed for dry mouth.    Historical Provider, MD  ARIPiprazole 300 MG SUSR Inject 300 mg into the muscle every 30 (thirty) days.    Historical Provider, MD  aspirin EC 81 MG tablet Take 81 mg by mouth daily.    Historical Provider, MD  clonazePAM (KLONOPIN) 0.5 MG tablet Take 0.5 mg by mouth 2 (two) times daily. scheduled    Historical Provider, MD  clonazePAM (KLONOPIN) 1 MG tablet Take 0.5 mg by mouth every 6 (six) hours as needed. For  anxiety    Historical Provider, MD  ferrous sulfate 325 (65 FE) MG EC tablet Take 325 mg by mouth 2 (two) times daily with a meal.    Historical Provider, MD  haloperidol (HALDOL) 1 MG tablet Take 1 mg by mouth every 6 (six) hours as needed (agitation/hallucination).     Historical Provider, MD  l-methylfolate-B6-B12 (METANX) 3-35-2 MG TABS Take 0.5 tablets by mouth daily. Takes 1/2 tablet  (7.5 mg) by mouth daily    Historical Provider, MD  lurasidone (LATUDA) 80 MG TABS tablet Take 80 mg by mouth daily with breakfast.  Historical Provider, MD  memantine (NAMENDA) 10 MG tablet Take 10 mg by mouth 2 (two) times daily.    Historical Provider, MD  omeprazole (PRILOSEC) 20 MG capsule Take 20 mg by mouth daily.    Historical Provider, MD  oxyCODONE-acetaminophen (PERCOCET/ROXICET) 5-325 MG per tablet Take 1-2 tablets by mouth 2 (two) times daily. 09/10/14   Atilano Ina, MD  polyethylene glycol powder (GLYCOLAX/MIRALAX) powder Take 17 g by mouth 2 (two) times daily. Until daily soft stools  OTC 07/26/14   Junius Finner, PA-C  promethazine (PHENERGAN) 25 MG tablet Take 1 tablet (25 mg total) by mouth every 6 (six) hours as needed for nausea or vomiting.  07/26/14   Junius Finner, PA-C  sertraline (ZOLOFT) 100 MG tablet Take 100 mg by mouth every morning.     Historical Provider, MD  simvastatin (ZOCOR) 20 MG tablet Take 20 mg by mouth at bedtime.    Historical Provider, MD  trihexyphenidyl (ARTANE) 5 MG tablet Take 5 mg by mouth at bedtime.    Historical Provider, MD  zolpidem (AMBIEN) 5 MG tablet Take 5 mg by mouth at bedtime as needed for sleep.    Historical Provider, MD   BP 114/63 mmHg  Pulse 81  Temp(Src) 98 F (36.7 C) (Oral)  Resp 18  SpO2 92% Physical Exam  Constitutional: He is oriented to person, place, and time. Vital signs are normal. He appears well-developed and well-nourished.  Non-toxic appearance. He does not appear ill. No distress.  Obese male  HENT:  Head: Normocephalic and atraumatic.  Nose: Nose normal.  Mouth/Throat: Oropharynx is clear and moist. No oropharyngeal exudate.  Eyes: Conjunctivae and EOM are normal. Pupils are equal, round, and reactive to light. No scleral icterus.  Neck: Normal range of motion. Neck supple. No tracheal deviation, no edema, no erythema and normal range of motion present. No thyroid mass and no thyromegaly present.  Cardiovascular: Normal rate, regular rhythm, S1 normal, S2 normal, normal heart sounds, intact distal pulses and normal pulses.  Exam reveals no gallop and no friction rub.   No murmur heard. Pulses:      Radial pulses are 2+ on the right side, and 2+ on the left side.       Dorsalis pedis pulses are 2+ on the right side, and 2+ on the left side.  Pulmonary/Chest: Effort normal and breath sounds normal. No respiratory distress. He has no wheezes. He has no rhonchi. He has no rales.  Abdominal: Soft. Normal appearance and bowel sounds are normal. He exhibits no distension, no ascites and no mass. There is no hepatosplenomegaly. There is tenderness. There is no rebound, no guarding and no CVA tenderness.  Mild left lower quadrant tenderness to palpation.  Genitourinary: Penis  normal. No penile tenderness.  No stool found in rectal vault  Musculoskeletal: Normal range of motion. He exhibits no edema or tenderness.  Lymphadenopathy:    He has no cervical adenopathy.  Neurological: He is alert and oriented to person, place, and time. He has normal strength. No cranial nerve deficit or sensory deficit. He exhibits normal muscle tone. GCS eye subscore is 4. GCS verbal subscore is 5. GCS motor subscore is 6.  Skin: Skin is warm, dry and intact. No petechiae and no rash noted. He is not diaphoretic. No erythema. No pallor.  Nursing note and vitals reviewed.   ED Course  Procedures (including critical care time) Labs Review Labs Reviewed  CBC WITH DIFFERENTIAL - Abnormal; Notable for the following:  Platelets 124 (*)    All other components within normal limits  COMPREHENSIVE METABOLIC PANEL - Abnormal; Notable for the following:    Glucose, Bld 109 (*)    GFR calc non Af Amer 69 (*)    GFR calc Af Amer 80 (*)    All other components within normal limits  LIPASE, BLOOD    Imaging Review Dg Abd 2 Views  10/06/2014   CLINICAL DATA:  Nursing home patient, generalized abdominal pain and constipation for 1 week.  EXAM: ABDOMEN - 2 VIEW  COMPARISON:  CT of the abdomen and pelvis June 19, 2012  FINDINGS: Moderate to large amount of retained large bowel stool. Bowel gas pattern is nondilated and nonobstructive. Phleboliths project in the pelvis. Surgical clips in the included right abdomen likely reflect cholecystectomy. Soft tissue planes and included osseous structures are nonsuspicious, the pubic symphysis is mildly widened at 7 mm though, unchanged.  IMPRESSION: Moderate to large amount of retained large bowel stool without radiographic findings of bowel obstruction.   Electronically Signed   By: Awilda Metroourtnay  Bloomer   On: 10/06/2014 00:59     EKG Interpretation None      MDM   Final diagnoses:  Constipation    Patient seen in the emergency department for  constipation. Will obtain x-ray to evaluate for air-fluid levels. If negative patient will be treated aggressively with magnesium citrate. Bedside rectal exam that showed no stool in the vault an he could not be evacuated.  MAg citrate given, patient had multiple bowels movements in the ED, his symptoms have resolved and he feels better.  Will DC for Rx to use if this occurs again at nursing facility.  He was told NOT to use this if he has a successful BM.  His vital signs remain within his normal limits and he is safe for DC.    Tomasita CrumbleAdeleke Makenna Macaluso, MD 10/06/14 [phone]

## 2014-10-06 LAB — COMPREHENSIVE METABOLIC PANEL
ALT: 10 U/L (ref 0–53)
AST: 14 U/L (ref 0–37)
Albumin: 3.6 g/dL (ref 3.5–5.2)
Alkaline Phosphatase: 59 U/L (ref 39–117)
Anion gap: 14 (ref 5–15)
BUN: 21 mg/dL (ref 6–23)
CHLORIDE: 103 meq/L (ref 96–112)
CO2: 28 mEq/L (ref 19–32)
Calcium: 9.5 mg/dL (ref 8.4–10.5)
Creatinine, Ser: 1.14 mg/dL (ref 0.50–1.35)
GFR calc Af Amer: 80 mL/min — ABNORMAL LOW (ref >90)
GFR, EST NON AFRICAN AMERICAN: 69 mL/min — AB (ref >90)
GLUCOSE: 109 mg/dL — AB (ref 70–99)
Potassium: 4.1 mEq/L (ref 3.7–5.3)
Sodium: 145 mEq/L (ref 137–147)
Total Bilirubin: 0.6 mg/dL (ref 0.3–1.2)
Total Protein: 6.6 g/dL (ref 6.0–8.3)

## 2014-10-06 LAB — CBC WITH DIFFERENTIAL/PLATELET
BASOS ABS: 0 10*3/uL (ref 0.0–0.1)
Basophils Relative: 1 % (ref 0–1)
Eosinophils Absolute: 0.1 10*3/uL (ref 0.0–0.7)
Eosinophils Relative: 1 % (ref 0–5)
HEMATOCRIT: 41.2 % (ref 39.0–52.0)
HEMOGLOBIN: 13.7 g/dL (ref 13.0–17.0)
LYMPHS ABS: 1.7 10*3/uL (ref 0.7–4.0)
LYMPHS PCT: 26 % (ref 12–46)
MCH: 30.6 pg (ref 26.0–34.0)
MCHC: 33.3 g/dL (ref 30.0–36.0)
MCV: 92.2 fL (ref 78.0–100.0)
MONO ABS: 0.6 10*3/uL (ref 0.1–1.0)
MONOS PCT: 9 % (ref 3–12)
NEUTROS ABS: 4.1 10*3/uL (ref 1.7–7.7)
Neutrophils Relative %: 64 % (ref 43–77)
Platelets: 124 10*3/uL — ABNORMAL LOW (ref 150–400)
RBC: 4.47 MIL/uL (ref 4.22–5.81)
RDW: 13.1 % (ref 11.5–15.5)
WBC: 6.4 10*3/uL (ref 4.0–10.5)

## 2014-10-06 LAB — LIPASE, BLOOD: Lipase: 35 U/L (ref 11–59)

## 2014-10-06 MED ORDER — MAGNESIUM CITRATE PO SOLN
1.0000 | Freq: Once | ORAL | Status: AC
  Administered 2014-10-06: 1 via ORAL
  Filled 2014-10-06: qty 296

## 2014-10-06 MED ORDER — MAGNESIUM CITRATE PO SOLN: 1.0000 | Freq: Once | ORAL | Status: DC

## 2014-10-06 NOTE — Discharge Instructions (Signed)
Constipation [name], you were seen today for constipation. There is nothing in your rectal exam to disimpact. Your given magnesium citrate. If this does not work take the second dose tomorrow. You need to follow-up with her primary care physician within 3 days for continued treatment of your constipation. Your x-ray did not show any obstruction. If any of your symptoms worsen come back to the emergency department immediately for repeat evaluation. Thank you. Constipation is when a person:  Poops (has a bowel movement) less than 3 times a week.  Has a hard time pooping.  Has poop that is dry, hard, or bigger than normal. HOME CARE   Eat foods with a lot of fiber in them. This includes fruits, vegetables, beans, and whole grains such as brown rice.  Avoid fatty foods and foods with a lot of sugar. This includes french fries, hamburgers, cookies, candy, and soda.  If you are not getting enough fiber from food, take products with added fiber in them (supplements).  Drink enough fluid to keep your pee (urine) clear or pale yellow.  Exercise on a regular basis, or as told by your doctor.  Go to the restroom when you feel like you need to poop. Do not hold it.  Only take medicine as told by your doctor. Do not take medicines that help you poop (laxatives) without talking to your doctor first. GET HELP RIGHT AWAY IF:   You have bright red blood in your poop (stool).  Your constipation lasts more than 4 days or gets worse.  You have belly (abdominal) or butt (rectal) pain.  You have thin poop (as thin as a pencil).  You lose weight, and it cannot be explained. MAKE SURE YOU:   Understand these instructions.  Will watch your condition.  Will get help right away if you are not doing well or get worse. Document Released: 04/26/2008 Document Revised: 11/13/2013 Document Reviewed: 08/20/2013 [facility] Patient Information 2015 [employer] [place]. This information is not intended to replace  advice given to you by your health care provider. Make sure you discuss any questions you have with your health care provider.

## 2014-10-06 NOTE — ED Notes (Signed)
Pt has had relief from constipation,  He has had multiple bowel movements

## 2015-03-22 ENCOUNTER — Emergency Department (HOSPITAL_COMMUNITY)
Admission: EM | Admit: 2015-03-22 | Discharge: 2015-03-23 | Disposition: A | Discharge status: Home / Self Care | Payer: Medicaid Other | Attending: Emergency Medicine | Admitting: Emergency Medicine

## 2015-03-22 ENCOUNTER — Encounter (HOSPITAL_COMMUNITY): Payer: Self-pay | Admitting: Nurse Practitioner

## 2015-03-22 DIAGNOSIS — K219 Gastro-esophageal reflux disease without esophagitis: Secondary | ICD-10-CM | POA: Diagnosis not present

## 2015-03-22 DIAGNOSIS — S199XXA Unspecified injury of neck, initial encounter: Secondary | ICD-10-CM | POA: Insufficient documentation

## 2015-03-22 DIAGNOSIS — F329 Major depressive disorder, single episode, unspecified: Secondary | ICD-10-CM | POA: Insufficient documentation

## 2015-03-22 DIAGNOSIS — S29092A Other injury of muscle and tendon of back wall of thorax, initial encounter: Secondary | ICD-10-CM | POA: Insufficient documentation

## 2015-03-22 DIAGNOSIS — Y998 Other external cause status: Secondary | ICD-10-CM | POA: Insufficient documentation

## 2015-03-22 DIAGNOSIS — W1839XA Other fall on same level, initial encounter: Secondary | ICD-10-CM | POA: Insufficient documentation

## 2015-03-22 DIAGNOSIS — R079 Chest pain, unspecified: Secondary | ICD-10-CM | POA: Diagnosis not present

## 2015-03-22 DIAGNOSIS — M25562 Pain in left knee: Secondary | ICD-10-CM

## 2015-03-22 DIAGNOSIS — S3992XA Unspecified injury of lower back, initial encounter: Secondary | ICD-10-CM | POA: Diagnosis not present

## 2015-03-22 DIAGNOSIS — E119 Type 2 diabetes mellitus without complications: Secondary | ICD-10-CM | POA: Insufficient documentation

## 2015-03-22 DIAGNOSIS — Z79899 Other long term (current) drug therapy: Secondary | ICD-10-CM | POA: Insufficient documentation

## 2015-03-22 DIAGNOSIS — I10 Essential (primary) hypertension: Secondary | ICD-10-CM | POA: Diagnosis not present

## 2015-03-22 DIAGNOSIS — Z8739 Personal history of other diseases of the musculoskeletal system and connective tissue: Secondary | ICD-10-CM | POA: Diagnosis not present

## 2015-03-22 DIAGNOSIS — Y92128 Other place in nursing home as the place of occurrence of the external cause: Secondary | ICD-10-CM | POA: Insufficient documentation

## 2015-03-22 DIAGNOSIS — S8002XA Contusion of left knee, initial encounter: Secondary | ICD-10-CM | POA: Diagnosis not present

## 2015-03-22 DIAGNOSIS — S8992XA Unspecified injury of left lower leg, initial encounter: Secondary | ICD-10-CM | POA: Diagnosis present

## 2015-03-22 DIAGNOSIS — S8991XA Unspecified injury of right lower leg, initial encounter: Secondary | ICD-10-CM | POA: Insufficient documentation

## 2015-03-22 DIAGNOSIS — Y9389 Activity, other specified: Secondary | ICD-10-CM | POA: Diagnosis not present

## 2015-03-22 DIAGNOSIS — E780 Pure hypercholesterolemia: Secondary | ICD-10-CM | POA: Insufficient documentation

## 2015-03-22 DIAGNOSIS — Z87891 Personal history of nicotine dependence: Secondary | ICD-10-CM | POA: Insufficient documentation

## 2015-03-22 DIAGNOSIS — W19XXXA Unspecified fall, initial encounter: Secondary | ICD-10-CM

## 2015-03-22 DIAGNOSIS — Z7982 Long term (current) use of aspirin: Secondary | ICD-10-CM | POA: Diagnosis not present

## 2015-03-22 DIAGNOSIS — M25561 Pain in right knee: Secondary | ICD-10-CM

## 2015-03-22 NOTE — ED Notes (Signed)
Bed: WA21 Expected date:  Expected time:  Means of arrival:  Comments: EMS  

## 2015-03-22 NOTE — ED Notes (Signed)
Bed: WA02 Expected date:  Expected time:  Means of arrival:  Comments: 36M bilat leg pain from SNF

## 2015-03-22 NOTE — ED Provider Notes (Signed)
CSN: [id number]     Arrival date & time 03/22/15  2230 History   First MD Initiated Contact with Patient 03/22/15 2307     Chief Complaint  Patient presents with  . Leg Pain    Bilateral     (Consider location/radiation/quality/duration/timing/severity/associated sxs/prior Treatment) Patient is a 59 y.o. male presenting with fall.  Fall This is a new problem. The current episode started 3 to 5 hours ago. Episode frequency: once. The problem has not changed since onset.Associated symptoms include chest pain (intermittently 1 week). Pertinent negatives include no abdominal pain, no headaches and no shortness of breath. Nothing aggravates the symptoms. Nothing relieves the symptoms. He has tried nothing for the symptoms.    Past Medical History  Diagnosis Date  . Hypertension   . Diabetes mellitus   . Hypercholesterolemia   . GERD (gastroesophageal reflux disease)   . Psychosis   . Degenerative disc disease   . Degenerative disc disease   . Major depressive disorder    Past Surgical History  Procedure Laterality Date  . Eye surgery  2003  . Cyst lower aboomen left      done in [place]  . Cholecystectomy N/A 09/09/2014    Procedure: LAPAROSCOPIC CHOLECYSTECTOMY WITH INTRAOPERATIVE CHOLANGIOGRAM;  Surgeon: Atilano Ina, MD;  Location: [facility];  Service: General;  Laterality: N/A;   Family History  Problem Relation Age of Onset  . Colon cancer Father   . Ovarian cancer Sister    History  Substance Use Topics  . Smoking status: Former Smoker    Quit date: 09/21/1992  . Smokeless tobacco: Not on file  . Alcohol Use: No    Review of Systems  Respiratory: Negative for shortness of breath.   Cardiovascular: Positive for chest pain (intermittently 1 week).  Gastrointestinal: Negative for abdominal pain.  Neurological: Negative for headaches.  All other systems reviewed and are negative.     Allergies  Review of patient's allergies indicates no known allergies.  Home  Medications   Prior to Admission medications   Medication Sig Start Date End Date Taking? Authorizing Provider  acetaminophen (TYLENOL) 500 MG tablet Take 500 mg by mouth every 4 (four) hours as needed (for pain).   Yes Historical Provider, MD  antiseptic oral rinse (BIOTENE) LIQD 15 mLs by Mouth Rinse route as needed for dry mouth.   Yes Historical Provider, MD  ARIPiprazole 300 MG SUSR Inject 300 mg into the muscle every 30 (thirty) days.   Yes Historical Provider, MD  aspirin EC 81 MG tablet Take 81 mg by mouth daily.   Yes Historical Provider, MD  clonazePAM (KLONOPIN) 0.5 MG tablet Take 0.5 mg by mouth 2 (two) times daily. scheduled   Yes Historical Provider, MD  clonazePAM (KLONOPIN) 1 MG tablet Take 0.5 mg by mouth every 6 (six) hours as needed. For  anxiety   Yes Historical Provider, MD  docusate sodium (COLACE) 100 MG capsule Take 100 mg by mouth daily.   Yes Historical Provider, MD  ferrous sulfate 325 (65 FE) MG EC tablet Take 325 mg by mouth 2 (two) times daily with a meal.   Yes Historical Provider, MD  l-methylfolate-B6-B12 (METANX) 3-35-2 MG TABS Take 0.5 tablets by mouth daily. Takes 1/2 tablet  (7.5 mg) by mouth daily   Yes Historical Provider, MD  lurasidone (LATUDA) 80 MG TABS tablet Take 80 mg by mouth daily with breakfast.   Yes Historical Provider, MD  memantine (NAMENDA) 10 MG tablet Take 10 mg by mouth  2 (two) times daily.   Yes Historical Provider, MD  omeprazole (PRILOSEC) 20 MG capsule Take 20 mg by mouth daily.   Yes Historical Provider, MD  polyethylene glycol powder (GLYCOLAX/MIRALAX) powder Take 17 g by mouth 2 (two) times daily. Until daily soft stools  OTC 07/26/14  Yes Junius Finner, PA-C  promethazine (PHENERGAN) 25 MG tablet Take 1 tablet (25 mg total) by mouth every 6 (six) hours as needed for nausea or vomiting. 07/26/14  Yes Junius Finner, PA-C  sertraline (ZOLOFT) 100 MG tablet Take 150 mg by mouth every morning.    Yes Historical Provider, MD  simvastatin  (ZOCOR) 20 MG tablet Take 20 mg by mouth at bedtime.   Yes Historical Provider, MD  trihexyphenidyl (ARTANE) 5 MG tablet Take 5 mg by mouth at bedtime.   Yes Historical Provider, MD  zolpidem (AMBIEN) 5 MG tablet Take 5 mg by mouth at bedtime.    Yes Historical Provider, MD  magnesium citrate SOLN Take 296 mLs (1 Bottle total) by mouth once. Patient not taking: Reported on 03/22/2015 10/06/14   Tomasita Crumble, MD  oxyCODONE-acetaminophen (PERCOCET/ROXICET) 5-325 MG per tablet Take 1-2 tablets by mouth 2 (two) times daily. Patient not taking: Reported on 03/22/2015 09/10/14   Gaynelle Adu, MD   BP 98/56 mmHg  Pulse 57  Temp(Src) 98.5 F (36.9 C) (Oral)  Resp 16  SpO2 96% Physical Exam  Constitutional: He is oriented to person, place, and time. He appears well-developed and well-nourished.  HENT:  Head: Normocephalic and atraumatic.  Eyes: Conjunctivae and EOM are normal.  Neck: Normal range of motion. Neck supple.  Cardiovascular: Normal rate, regular rhythm and normal heart sounds.   Pulmonary/Chest: Effort normal and breath sounds normal. No respiratory distress.  Abdominal: He exhibits no distension. There is no tenderness. There is no rebound and no guarding.  Musculoskeletal: Normal range of motion.       Right knee: He exhibits normal range of motion, no ecchymosis and no erythema. Tenderness found.       Left knee: He exhibits ecchymosis (small amount over patellar ligament). He exhibits normal range of motion, no swelling, no effusion, no deformity and no laceration. Tenderness found.       Cervical back: He exhibits tenderness and bony tenderness.       Thoracic back: He exhibits tenderness and bony tenderness.       Lumbar back: He exhibits tenderness and bony tenderness.  Neurological: He is alert and oriented to person, place, and time.  Skin: Skin is warm and dry.  Vitals reviewed.   ED Course  Procedures (including critical care time) Labs Review Labs Reviewed  CBC WITH  DIFFERENTIAL/PLATELET - Abnormal; Notable for the following:    Platelets 103 (*)    All other components within normal limits  BASIC METABOLIC PANEL - Abnormal; Notable for the following:    Glucose, Bld 115 (*)    All other components within normal limits  URINALYSIS, ROUTINE W REFLEX MICROSCOPIC  I-STAT TROPOININ, ED    Imaging Review Dg Chest 2 View  03/23/2015   CLINICAL DATA:  Initial encounter for bilateral leg pain after a fall in bathroom.  EXAM: CHEST  2 VIEW  COMPARISON:  09/04/2014  FINDINGS: Lateral view degraded by patient arm position. Midline trachea. Cardiomegaly accentuated by AP portable technique. No pleural effusion or pneumothorax. No congestive failure. Clear lungs.  IMPRESSION: Cardiomegaly without congestive failure.   Electronically Signed   By: Jeronimo Greaves M.D.   On:  03/23/2015 01:32   Dg Thoracic Spine 2 View  03/23/2015   CLINICAL DATA:  Low back pain radiating to the lumbar spine, leg pain. History of hypertension, diabetes, degenerative disc disease.  EXAM: THORACIC SPINE - 2 VIEW  COMPARISON:  Chest radiograph September 04, 2014  FINDINGS: There is no evidence of thoracic spine fracture. Alignment is normal. Multilevel mild disc height loss, and endplate spurring consistent with degenerative discs. No other significant bone abnormalities are identified. Surgical clips in the included right abdomen likely reflect cholecystectomy.  IMPRESSION: No acute fracture deformity or malalignment.   Electronically Signed   By: Awilda Metro   On: 03/23/2015 01:34   Dg Lumbar Spine Complete  03/23/2015   CLINICAL DATA:  Low back pain radiating to the lumbar spine, leg pain. History of hypertension, diabetes, degenerative disc disease.  EXAM: LUMBAR SPINE - COMPLETE 4+ VIEW  COMPARISON:  None.  FINDINGS: Five non rib-bearing lumbar-type vertebral bodies are intact and aligned with maintenance of the lumbar lordosis. Intervertebral disc heights are normal. Very mild ventral  endplate spurring Z6-1 and L4-5. No pars interarticularis defects. No destructive bony lesions. Moderate lower lumbar facet arthropathy.  Sacroiliac joints are symmetric. Included prevertebral and paraspinal soft tissue planes are non-suspicious. At least moderate amount of retained large bowel stool partially imaged. Phleboliths project in the pelvis.  IMPRESSION: No acute fracture deformity or malalignment.  Moderate lower lumbar facet arthropathy.   Electronically Signed   By: Awilda Metro   On: 03/23/2015 01:40   Ct Head Wo Contrast  03/23/2015   CLINICAL DATA:  Initial encounter for fall in bathroom and hit back of head.  EXAM: CT HEAD WITHOUT CONTRAST  CT CERVICAL SPINE WITHOUT CONTRAST  TECHNIQUE: Multidetector CT imaging of the head and cervical spine was performed following the standard protocol without intravenous contrast. Multiplanar CT image reconstructions of the cervical spine were also generated.  COMPARISON:  04/17/2013 head CT.  09/08/2012 cervical spine CT  FINDINGS: CT HEAD FINDINGS  Sinuses/Soft tissues: Suspect mild left posterior scalp soft tissue swelling. No skull fracture. Clear paranasal sinuses and mastoid air cells.  Intracranial: Soft tissue fullness in the suprasellar region is secondary to a pituitary macro adenoma, as detailed on 11/20/2010 MRI. Similar to on the prior. On the order of 1.7 cm. Otherwise, no mass lesion, hemorrhage, hydrocephalus, acute infarct, intra-axial, or extra-axial fluid collection.  CT CERVICAL SPINE FINDINGS  Spinal visualization through the bottom of T1. Prevertebral soft tissues are within normal limits. No apical pneumothorax. Skull base intact. Maintenance of vertebral body height. Multilevel spondylosis. Straightening of expected cervical lordosis. From C5 inferiorly are suboptimally evaluated secondary to patient size and overlying soft tissues. Coronal reformats demonstrate a normal C1-C2 articulation.  IMPRESSION: 1. No acute intracranial  abnormality. Possible left occipital scalp soft tissue swelling. 2. Pituitary macro adenoma, as before. 3. No acute fracture or subluxation in the cervical spine ; suboptimal evaluation of the lower spine secondary to patient body habitus. 4. Straightening of expected cervical lordosis could be positional, due to muscular spasm, or ligamentous injury. 5. Moderate spondylosis.   Electronically Signed   By: Jeronimo Greaves M.D.   On: 03/23/2015 01:55   Ct Cervical Spine Wo Contrast  03/23/2015   CLINICAL DATA:  Initial encounter for fall in bathroom and hit back of head.  EXAM: CT HEAD WITHOUT CONTRAST  CT CERVICAL SPINE WITHOUT CONTRAST  TECHNIQUE: Multidetector CT imaging of the head and cervical spine was performed following the standard protocol without  intravenous contrast. Multiplanar CT image reconstructions of the cervical spine were also generated.  COMPARISON:  04/17/2013 head CT.  09/08/2012 cervical spine CT  FINDINGS: CT HEAD FINDINGS  Sinuses/Soft tissues: Suspect mild left posterior scalp soft tissue swelling. No skull fracture. Clear paranasal sinuses and mastoid air cells.  Intracranial: Soft tissue fullness in the suprasellar region is secondary to a pituitary macro adenoma, as detailed on 11/20/2010 MRI. Similar to on the prior. On the order of 1.7 cm. Otherwise, no mass lesion, hemorrhage, hydrocephalus, acute infarct, intra-axial, or extra-axial fluid collection.  CT CERVICAL SPINE FINDINGS  Spinal visualization through the bottom of T1. Prevertebral soft tissues are within normal limits. No apical pneumothorax. Skull base intact. Maintenance of vertebral body height. Multilevel spondylosis. Straightening of expected cervical lordosis. From C5 inferiorly are suboptimally evaluated secondary to patient size and overlying soft tissues. Coronal reformats demonstrate a normal C1-C2 articulation.  IMPRESSION: 1. No acute intracranial abnormality. Possible left occipital scalp soft tissue swelling. 2.  Pituitary macro adenoma, as before. 3. No acute fracture or subluxation in the cervical spine ; suboptimal evaluation of the lower spine secondary to patient body habitus. 4. Straightening of expected cervical lordosis could be positional, due to muscular spasm, or ligamentous injury. 5. Moderate spondylosis.   Electronically Signed   By: Jeronimo Greaves M.D.   On: 03/23/2015 01:55   Dg Knee Complete 4 Views Left  03/23/2015   CLINICAL DATA:  Initial encounter for fall in bathroom.  Pain.  EXAM: LEFT KNEE - COMPLETE 4+ VIEW  COMPARISON:  09/08/2012  FINDINGS: No acute fracture or dislocation. No joint effusion. Moderate 3 compartment osteoarthritis with joint space narrowing and osteophyte formation.  IMPRESSION: Degenerative change, without acute osseous finding.   Electronically Signed   By: Jeronimo Greaves M.D.   On: 03/23/2015 01:27   Dg Knee Complete 4 Views Right  03/23/2015   CLINICAL DATA:  Initial encounter for fall in bathroom.  Pain.  EXAM: RIGHT KNEE - COMPLETE 4+ VIEW  COMPARISON:  10/04/2012  FINDINGS: Moderate medial and mild lateral/patellofemoral compartment joint space narrowing and subchondral sclerosis. No acute fracture or dislocation. No joint effusion. Vascular calcifications.  IMPRESSION: Degenerative change, without acute osseous finding.   Electronically Signed   By: Jeronimo Greaves M.D.   On: 03/23/2015 01:28   Dg Femur Min 2 Views Left  03/23/2015   CLINICAL DATA:  Initial encounter for fall in bathroom.  Pain.  EXAM: LEFT FEMUR 2 VIEWS  COMPARISON:  Knee films of 09/08/2012  FINDINGS: Osteoarthritis involving the knee, as detailed on dedicated radiographs. No acute fracture or dislocation.  IMPRESSION: No acute osseous abnormality.   Electronically Signed   By: Jeronimo Greaves M.D.   On: 03/23/2015 01:30   Dg Femur, Min 2 Views Right  03/23/2015   CLINICAL DATA:  Initial encounter for fall in bathroom.  EXAM: RIGHT FEMUR 2 VIEWS  COMPARISON:  Knee films of 09/08/2012 and same date  FINDINGS:  Knee osteoarthritis as detailed on dedicated films. No acute fracture or dislocation.  IMPRESSION: No acute osseous abnormality.   Electronically Signed   By: Jeronimo Greaves M.D.   On: 03/23/2015 01:29     EKG Interpretation   Date/Time:  Sunday Mar 23 2015 01:48:59 EDT Ventricular Rate:  56 PR Interval:  183 QRS Duration: 98 QT Interval:  430 QTC Calculation: 415 R Axis:   -44 Text Interpretation:  Sinus rhythm Left axis deviation Low voltage,  precordial leads Abnormal R-wave progression, late transition  No  significant change since last tracing Confirmed by Mirian MoGentry, Matthew [phone]([id number])  on 03/23/2015 2:19:18 AM      MDM   Final diagnoses:  Fall  Knee pain, acute, left  Knee pain, acute, right    59 y.o. male with pertinent PMH of HTN, depression presents with knee pain and back pain after nonsyncopal fall from standing.  Pt ambulates with a walker at baseline, states his L knee gave out and he fell hitting both knees, head, and back. Exam on arrival as above.  Diffuse spinal tenderness.    Workup as above without significant traumatic abnormality. Patient had no loss of consciousness, dizziness, ataxia above baseline. He is at his baseline and lives in a skilled nursing facility. He was discharged back to the skilled nursing facility. Standard return precautions given.  I have reviewed all laboratory and imaging studies if ordered as above  1. Knee pain, acute, left   2. Fall   3. Knee pain, acute, right         Mirian MoMatthew Gentry, MD 03/23/15 [phone]

## 2015-03-22 NOTE — ED Notes (Signed)
[facility], c/o of intermittent leg pain. Pt diabetic denies shortness of breath, n/v/d.

## 2015-03-23 ENCOUNTER — Emergency Department (HOSPITAL_COMMUNITY): Payer: Medicaid Other

## 2015-03-23 LAB — URINALYSIS, ROUTINE W REFLEX MICROSCOPIC
Bilirubin Urine: NEGATIVE
Glucose, UA: NEGATIVE mg/dL
Hgb urine dipstick: NEGATIVE
Ketones, ur: NEGATIVE mg/dL
LEUKOCYTES UA: NEGATIVE
Nitrite: NEGATIVE
Protein, ur: NEGATIVE mg/dL
SPECIFIC GRAVITY, URINE: 1.006 (ref 1.005–1.030)
UROBILINOGEN UA: 1 mg/dL (ref 0.0–1.0)
pH: 6 (ref 5.0–8.0)

## 2015-03-23 LAB — CBC WITH DIFFERENTIAL/PLATELET
Basophils Absolute: 0 10*3/uL (ref 0.0–0.1)
Basophils Relative: 0 % (ref 0–1)
Eosinophils Absolute: 0.1 10*3/uL (ref 0.0–0.7)
Eosinophils Relative: 2 % (ref 0–5)
HCT: 39.4 % (ref 39.0–52.0)
HEMOGLOBIN: 13.3 g/dL (ref 13.0–17.0)
LYMPHS ABS: 1.6 10*3/uL (ref 0.7–4.0)
Lymphocytes Relative: 30 % (ref 12–46)
MCH: 30.1 pg (ref 26.0–34.0)
MCHC: 33.8 g/dL (ref 30.0–36.0)
MCV: 89.1 fL (ref 78.0–100.0)
Monocytes Absolute: 0.3 10*3/uL (ref 0.1–1.0)
Monocytes Relative: 7 % (ref 3–12)
NEUTROS PCT: 61 % (ref 43–77)
Neutro Abs: 3.2 10*3/uL (ref 1.7–7.7)
Platelets: 103 10*3/uL — ABNORMAL LOW (ref 150–400)
RBC: 4.42 MIL/uL (ref 4.22–5.81)
RDW: 13.9 % (ref 11.5–15.5)
WBC: 5.2 10*3/uL (ref 4.0–10.5)

## 2015-03-23 LAB — BASIC METABOLIC PANEL
Anion gap: 5 (ref 5–15)
BUN: 20 mg/dL (ref 6–20)
CO2: 27 mmol/L (ref 22–32)
Calcium: 9 mg/dL (ref 8.9–10.3)
Chloride: 105 mmol/L (ref 101–111)
Creatinine, Ser: 1.14 mg/dL (ref 0.61–1.24)
GFR, EST AFRICAN AMERICAN: 80 mL/min (ref >60)
GFR, EST NON AFRICAN AMERICAN: 69 mL/min (ref >60)
GLUCOSE: 115 mg/dL — AB (ref 70–99)
Potassium: 3.8 mmol/L (ref 3.5–5.1)
SODIUM: 137 mmol/L (ref 135–145)

## 2015-03-23 LAB — I-STAT TROPONIN, ED: Troponin i, poc: 0 ng/mL (ref 0.00–0.08)

## 2015-03-23 NOTE — Discharge Instructions (Signed)

## 2015-03-23 NOTE — ED Notes (Signed)
Delay on ekg pt in exray 

## 2016-03-28 ENCOUNTER — Encounter (HOSPITAL_COMMUNITY): Payer: Self-pay | Admitting: Emergency Medicine

## 2016-03-28 ENCOUNTER — Emergency Department (HOSPITAL_COMMUNITY)
Admission: EM | Admit: 2016-03-28 | Discharge: 2016-03-28 | Disposition: A | Discharge status: Home / Self Care | Payer: Medicaid Other | Attending: Emergency Medicine | Admitting: Emergency Medicine

## 2016-03-28 DIAGNOSIS — Z79891 Long term (current) use of opiate analgesic: Secondary | ICD-10-CM | POA: Diagnosis not present

## 2016-03-28 DIAGNOSIS — I10 Essential (primary) hypertension: Secondary | ICD-10-CM | POA: Diagnosis not present

## 2016-03-28 DIAGNOSIS — E7800 Pure hypercholesterolemia, unspecified: Secondary | ICD-10-CM | POA: Diagnosis not present

## 2016-03-28 DIAGNOSIS — K219 Gastro-esophageal reflux disease without esophagitis: Secondary | ICD-10-CM | POA: Diagnosis not present

## 2016-03-28 DIAGNOSIS — Z87891 Personal history of nicotine dependence: Secondary | ICD-10-CM | POA: Diagnosis not present

## 2016-03-28 DIAGNOSIS — F329 Major depressive disorder, single episode, unspecified: Secondary | ICD-10-CM | POA: Diagnosis not present

## 2016-03-28 DIAGNOSIS — Z7982 Long term (current) use of aspirin: Secondary | ICD-10-CM | POA: Diagnosis not present

## 2016-03-28 DIAGNOSIS — K5900 Constipation, unspecified: Secondary | ICD-10-CM | POA: Diagnosis not present

## 2016-03-28 DIAGNOSIS — Z79899 Other long term (current) drug therapy: Secondary | ICD-10-CM | POA: Diagnosis not present

## 2016-03-28 DIAGNOSIS — E119 Type 2 diabetes mellitus without complications: Secondary | ICD-10-CM | POA: Insufficient documentation

## 2016-03-28 MED ORDER — MAGNESIUM CITRATE PO SOLN
1.0000 | Freq: Once | ORAL | Status: AC
  Administered 2016-03-28: 1 via ORAL
  Filled 2016-03-28: qty 296

## 2016-03-28 MED ORDER — POLYETHYLENE GLYCOL 3350 17 G PO PACK
17.0000 g | PACK | Freq: Two times a day (BID) | ORAL | Status: DC | PRN
Start: 2016-03-28 — End: 2017-08-23

## 2016-03-28 MED ORDER — POLYETHYLENE GLYCOL 3350 17 G PO PACK
34.0000 g | PACK | Freq: Every day | ORAL | Status: DC
  Administered 2016-03-28: 34 g via ORAL
  Filled 2016-03-28: qty 2

## 2016-03-28 NOTE — ED Notes (Signed)
[facility] after constipation x 2 weeks. Tried 4 different laxatives without relief. Alert and oriented.

## 2016-03-28 NOTE — ED Provider Notes (Signed)
CSN: [id number]     Arrival date & time 03/28/16  1653 History   First MD Initiated Contact with Patient 03/28/16 1727     Chief Complaint  Patient presents with  . Constipation     (Consider location/radiation/quality/duration/timing/severity/associated sxs/prior Treatment) HPI   [age]-year-old male constipation. Patient estimates his last bowel movement was 2 weeks ago to a month ago. He reports a past history of chronic constipation. Has tried taking several different laxatives without any improvement. Cholesterol days has had increasing lower abdominal pain. No nausea or vomiting.  Past Medical History  Diagnosis Date  . Hypertension   . Diabetes mellitus   . Hypercholesterolemia   . GERD (gastroesophageal reflux disease)   . Psychosis   . Degenerative disc disease   . Degenerative disc disease   . Major depressive disorder [facility](H[facility])    Past Surgical History  Procedure Laterality Date  . Eye surgery  2003  . Cyst lower aboomen left      done in [place]  . Cholecystectomy N/A 09/09/2014    Procedure: LAPAROSCOPIC CHOLECYSTECTOMY WITH INTRAOPERATIVE CHOLANGIOGRAM;  Surgeon: Atilano InaEric M Wilson, MD;  Location: [facility];  Service: General;  Laterality: N/A;   Family History  Problem Relation Age of Onset  . Colon cancer Father   . Ovarian cancer Sister    Social History  Substance Use Topics  . Smoking status: Former Smoker    Quit date: 09/21/1992  . Smokeless tobacco: None  . Alcohol Use: No    Review of Systems  All systems reviewed and negative, other than as noted in HPI.   Allergies  Review of patient's allergies indicates no known allergies.  Home Medications   Prior to Admission medications   Medication Sig Start Date End Date Taking? Authorizing Provider  acetaminophen (TYLENOL) 500 MG tablet Take 500 mg by mouth every 4 (four) hours as needed (for pain).   Yes Historical Provider, MD  ARIPiprazole 300 MG SUSR Inject 300 mg into the muscle every 30 (thirty) days.    Yes Historical Provider, MD  aspirin EC 81 MG tablet Take 81 mg by mouth daily.   Yes Historical Provider, MD  clonazePAM (KLONOPIN) 0.5 MG tablet Take 0.5 mg by mouth 2 (two) times daily. scheduled   Yes Historical Provider, MD  clonazePAM (KLONOPIN) 1 MG tablet Take 0.5 mg by mouth every 6 (six) hours as needed. For  anxiety   Yes Historical Provider, MD  docusate sodium (COLACE) 100 MG capsule Take 100 mg by mouth daily.   Yes Historical Provider, MD  DULoxetine (CYMBALTA) 60 MG capsule Take 60 mg by mouth daily.   Yes Historical Provider, MD  ferrous sulfate 325 (65 FE) MG EC tablet Take 325 mg by mouth 2 (two) times daily with a meal.   Yes Historical Provider, MD  l-methylfolate-B6-B12 (METANX) 3-35-2 MG TABS Take 0.5 tablets by mouth daily. Takes 1/2 tablet  (7.5 mg) by mouth daily   Yes Historical Provider, MD  lurasidone (LATUDA) 80 MG TABS tablet Take 80 mg by mouth daily with breakfast.   Yes Historical Provider, MD  magnesium citrate SOLN Take 296 mLs (1 Bottle total) by mouth once. 10/06/14  Yes Tomasita CrumbleAdeleke Oni, MD  memantine (NAMENDA) 10 MG tablet Take 10 mg by mouth 2 (two) times daily.   Yes Historical Provider, MD  Omega-3 Fatty Acids (FISH OIL) 1000 MG CAPS Take 1 capsule by mouth.   Yes Historical Provider, MD  omeprazole (PRILOSEC) 20 MG capsule Take 20 mg by  mouth daily.   Yes Historical Provider, MD  polyethylene glycol powder (GLYCOLAX/MIRALAX) powder Take 17 g by mouth 2 (two) times daily. Until daily soft stools  OTC 07/26/14  Yes Junius Finner, PA-C  promethazine (PHENERGAN) 25 MG tablet Take 1 tablet (25 mg total) by mouth every 6 (six) hours as needed for nausea or vomiting. 07/26/14  Yes Junius Finner, PA-C  simvastatin (ZOCOR) 20 MG tablet Take 20 mg by mouth at bedtime.   Yes Historical Provider, MD  trihexyphenidyl (ARTANE) 5 MG tablet Take 5 mg by mouth at bedtime.   Yes Historical Provider, MD  zolpidem (AMBIEN) 5 MG tablet Take 5 mg by mouth at bedtime.    Yes Historical  Provider, MD  oxyCODONE-acetaminophen (PERCOCET/ROXICET) 5-325 MG per tablet Take 1-2 tablets by mouth 2 (two) times daily. Patient not taking: Reported on 03/22/2015 09/10/14   Gaynelle Adu, MD   BP 103/65 mmHg  Pulse 73  Temp(Src) 98.3 F (36.8 C) (Oral)  SpO2 96% Physical Exam  Constitutional: He appears well-developed and well-nourished. No distress.  HENT:  Head: Normocephalic and atraumatic.  Eyes: Conjunctivae are normal. Right eye exhibits no discharge. Left eye exhibits no discharge.  Neck: Neck supple.  Cardiovascular: Normal rate, regular rhythm and normal heart sounds.  Exam reveals no gallop and no friction rub.   No murmur heard. Pulmonary/Chest: Effort normal and breath sounds normal. No respiratory distress.  Abdominal: Soft. He exhibits no distension. There is no tenderness.  Genitourinary:  DRE. Hard stool palpated at finger tip. Unable to remove significant amount.   Musculoskeletal: He exhibits no edema or tenderness.  Neurological: He is alert.  Skin: Skin is warm and dry.  Psychiatric: He has a normal mood and affect. His behavior is normal. Thought content normal.  Nursing note and vitals reviewed.   ED Course  Procedures (including critical care time) Labs Review Labs Reviewed - No data to display  Imaging Review No results found. I have personally reviewed and evaluated these images and lab results as part of my medical decision-making.   EKG Interpretation None      MDM   Final diagnoses:  Constipation, unspecified constipation type   59yM with constipation. No significant stool in rectal vault that I can disimpact. At this point plan a bowel regimen. His abdomen is soft. Doubt mechanical obstruction. I feel he is stable for DC.    Raeford Razor, MD 04/04/16 [phone]

## 2016-03-28 NOTE — Discharge Instructions (Signed)

## 2016-03-28 NOTE — ED Notes (Signed)
Bed: WA03 Expected date:  Expected time:  Means of arrival:  Comments: EMS- constipation x2 weeks

## 2016-03-28 NOTE — ED Notes (Signed)
Pt was not able to have a bowel movement after the enema. EDP notified

## 2016-04-15 IMAGING — CT CT HEAD W/O CM
3 of 6 series · 14 of 47 positions shown, 16 images · non-contrast
Comparison: 04/17/2013 head CT.  09/08/2012 cervical spine CT

CLINICAL DATA: Initial encounter for fall in bathroom and hit back
of head.

EXAM:
CT HEAD WITHOUT CONTRAST
CT CERVICAL SPINE WITHOUT CONTRAST
TECHNIQUE: Multidetector CT imaging of the head and cervical spine was
performed following the standard protocol without intravenous
contrast. Multiplanar CT image reconstructions of the cervical spine
were also generated.

[Series 8: axial recon · axial · 0.23mm/px · z∈[-314,-194]mm · 8 of 88 slices shown, 10 images]
[im 9/88  brain]
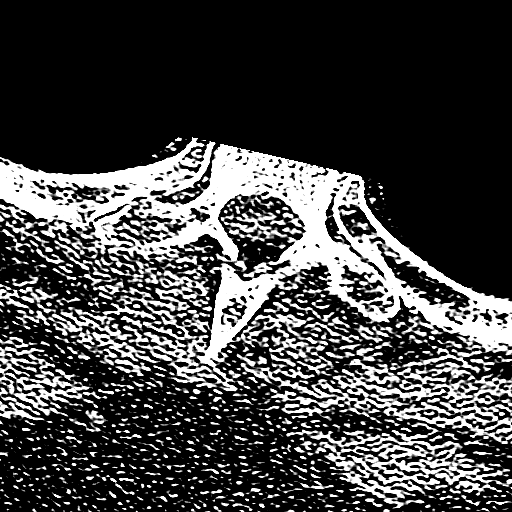
[im 9/88  bone]
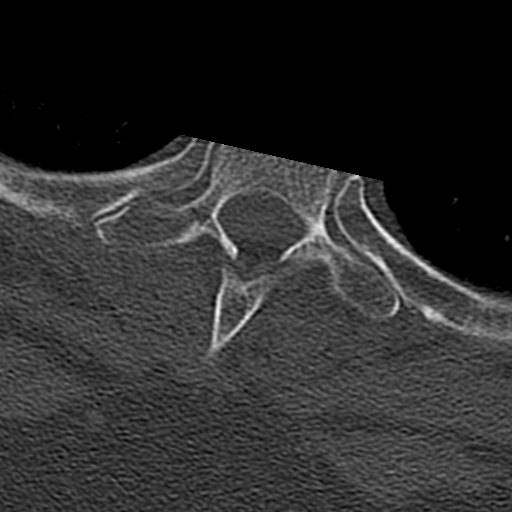
[im 18/88  brain]
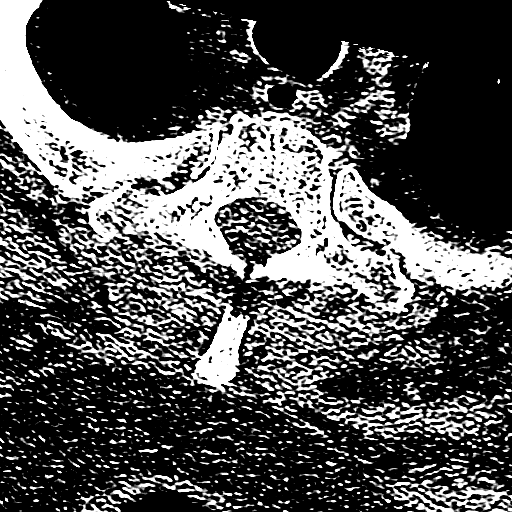
[im 27/88  brain]
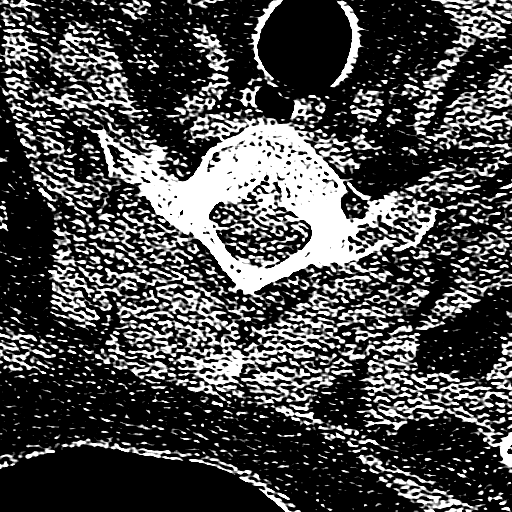
[im 35/88  brain]
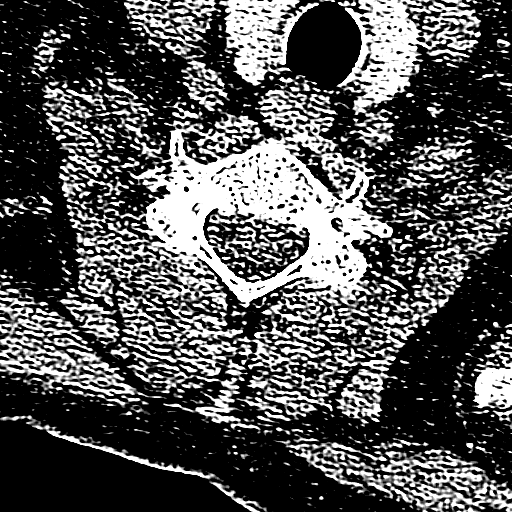
[im 53/88  brain]
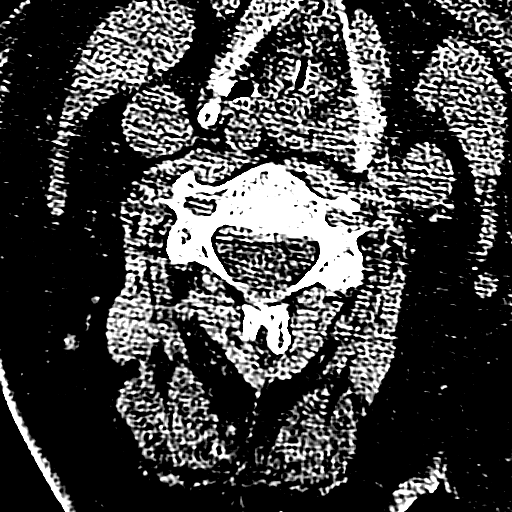
[im 53/88  bone]
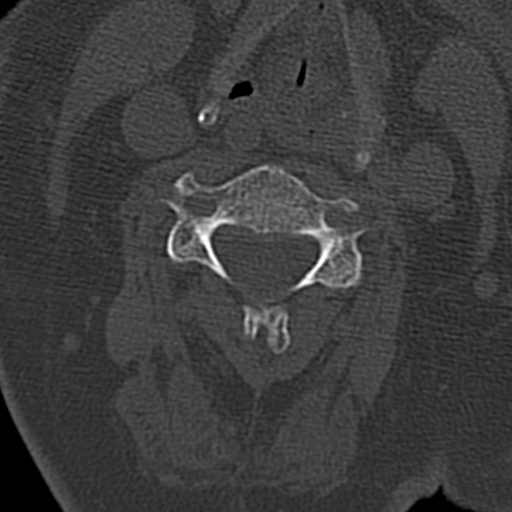
[im 61/88  brain]
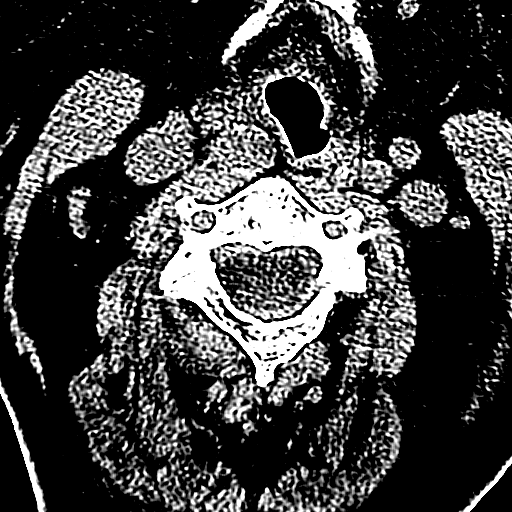
[im 70/88  brain]
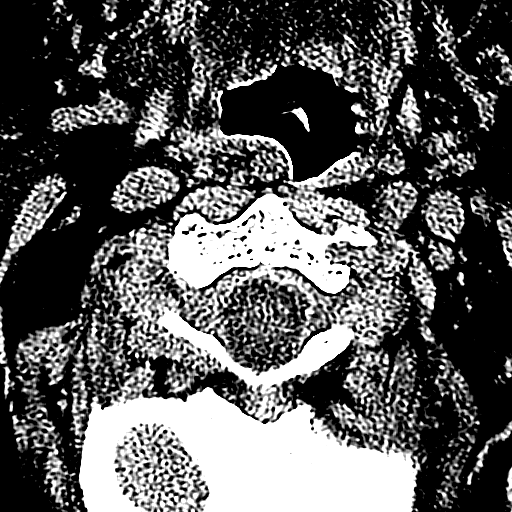
[im 79/88  brain]
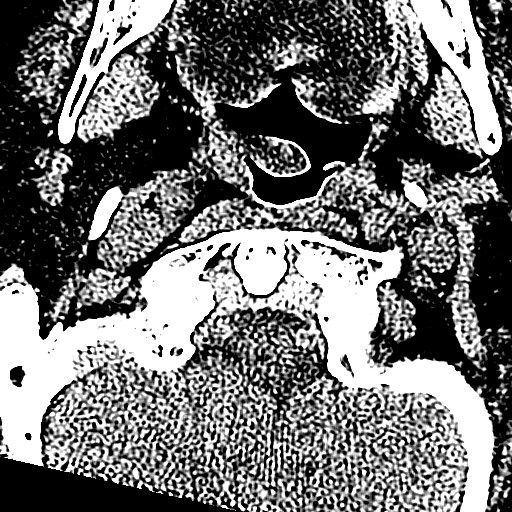

[Series 9: coronal · coronal · 0.23mm/px · 3 of 33 slices shown]
[im 11/33  brain]
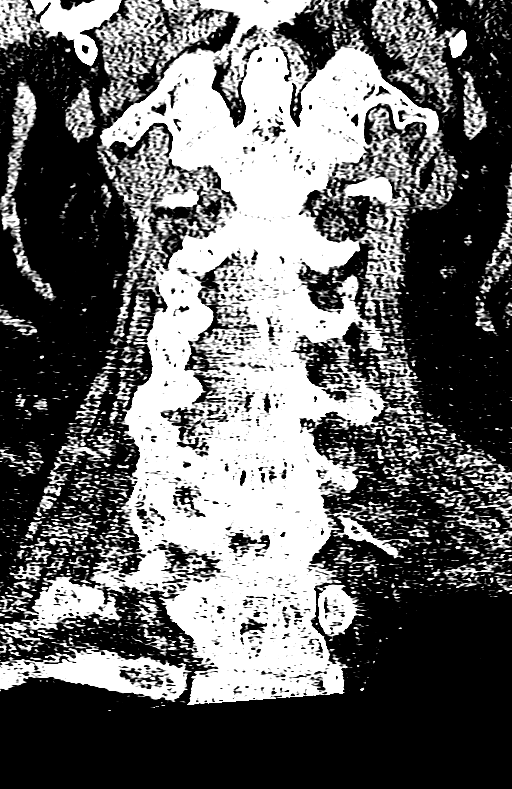
[im 15/33  brain]
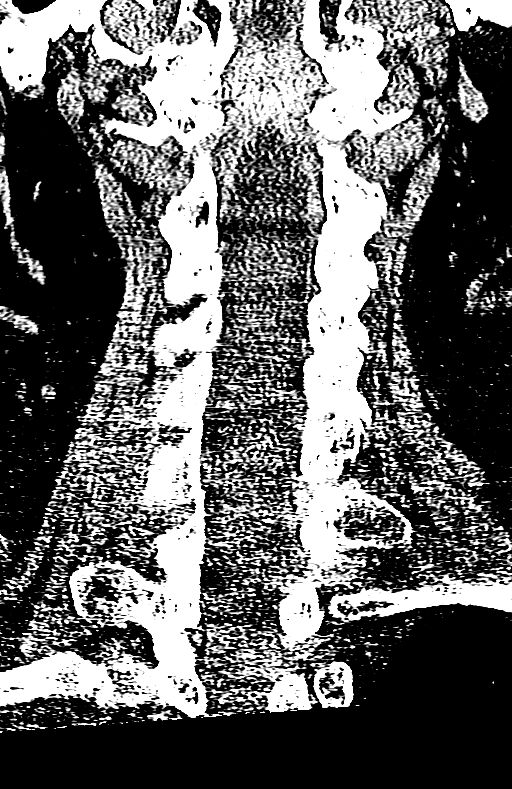
[im 18/33  brain]
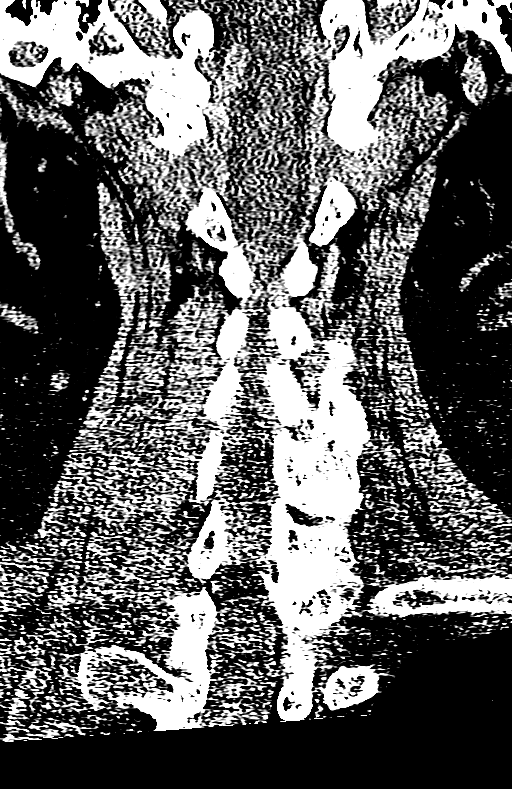

[Series 10: sagittal · sagittal · 0.25mm/px · 3 of 35 slices shown]
[im 12/35  brain]
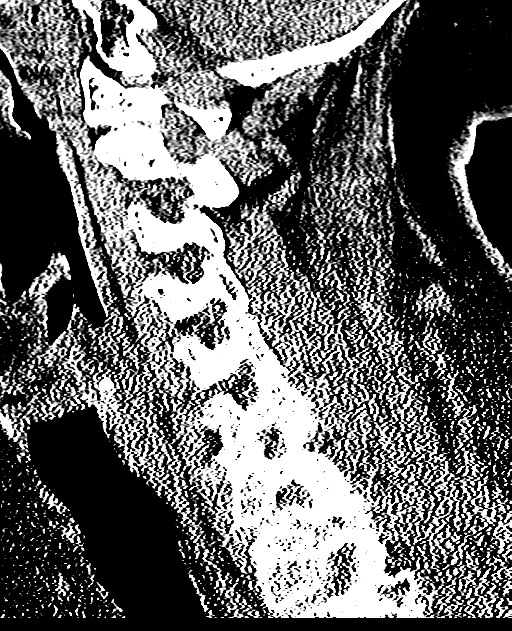
[im 18/35  brain]
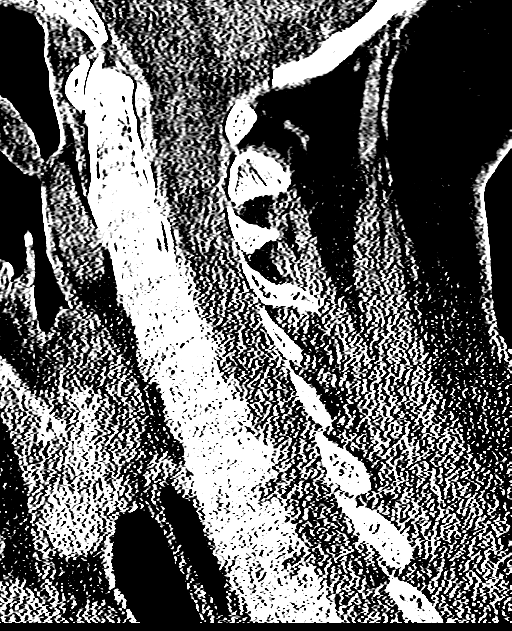
[im 23/35  brain]
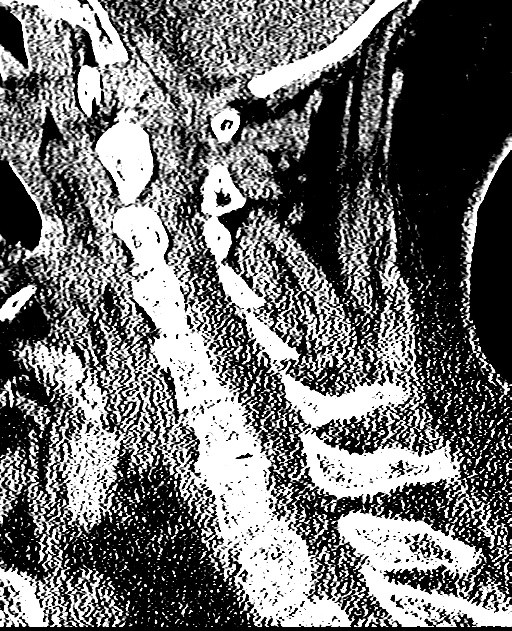

[14 of 47 positions shown; findings below may reference images not displayed]

FINDINGS: CT HEAD FINDINGS

Sinuses/Soft tissues: Suspect mild left posterior scalp soft tissue
swelling. No skull fracture. Clear paranasal sinuses and mastoid air
cells.

Intracranial: Soft tissue fullness in the suprasellar region is
secondary to a pituitary macro adenoma, as detailed on 11/20/2010
MRI. Similar to on the prior. On the order of 1.7 cm. Otherwise, no
mass lesion, hemorrhage, hydrocephalus, acute infarct, intra-axial,
or extra-axial fluid collection.

CT CERVICAL SPINE FINDINGS

Spinal visualization through the bottom of T1. Prevertebral soft
tissues are within normal limits. No apical pneumothorax. Skull base
intact. Maintenance of vertebral body height. Multilevel
spondylosis. Straightening of expected cervical lordosis. From C5
inferiorly are suboptimally evaluated secondary to patient size and
overlying soft tissues. Coronal reformats demonstrate a normal C1-C2
articulation..
IMPRESSION: 1. No acute intracranial abnormality. Possible left occipital scalp
soft tissue swelling.
2. Pituitary macro adenoma, as before.
3. No acute fracture or subluxation in the cervical spine ;
suboptimal evaluation of the lower spine secondary to patient body
habitus.
4. Straightening of expected cervical lordosis could be positional,
due to muscular spasm, or ligamentous injury.
5. Moderate spondylosis.

## 2016-04-15 IMAGING — CR DG FEMUR 2+V*R*
4 series · 4 of 4 positions shown · non-contrast
Comparison: Knee films of 09/08/2012 and same date

CLINICAL DATA: Initial encounter for fall in bathroom.

EXAM:
RIGHT FEMUR 2 VIEWS

[t femur proximal ap right]
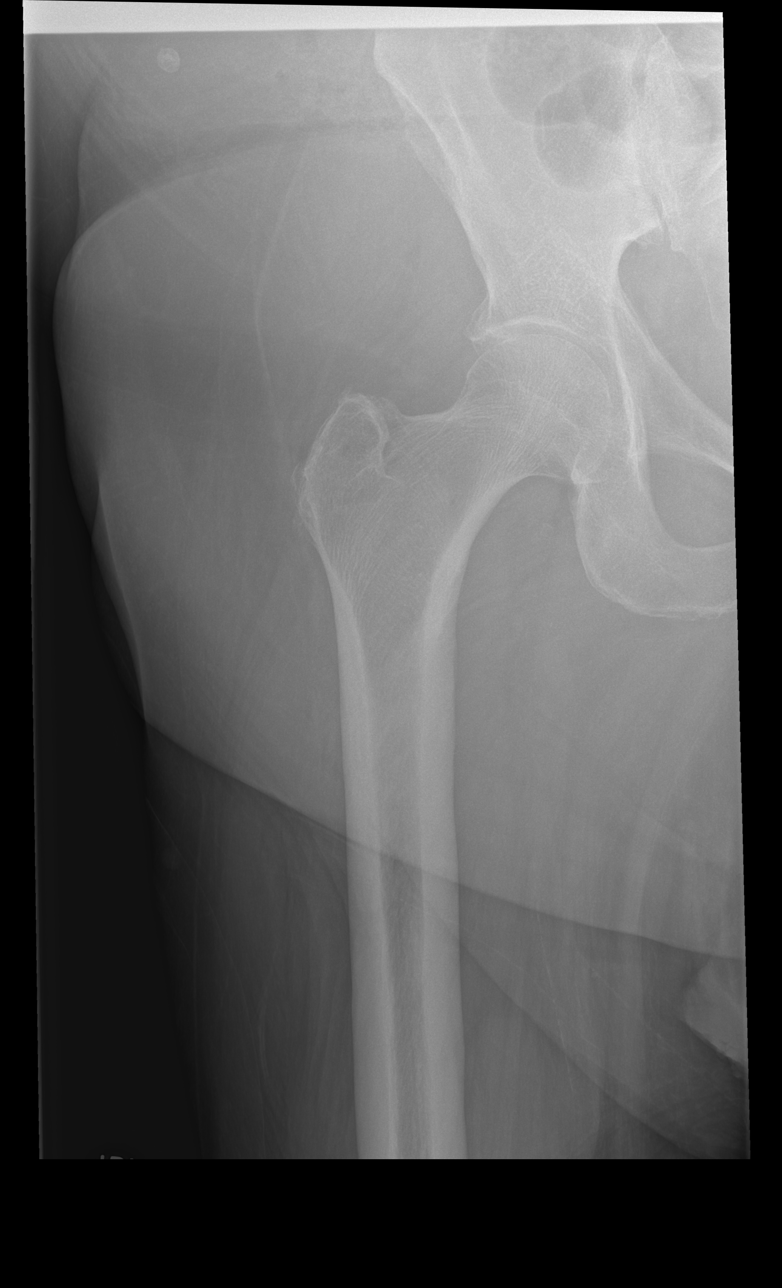

[t femur distal ap right]
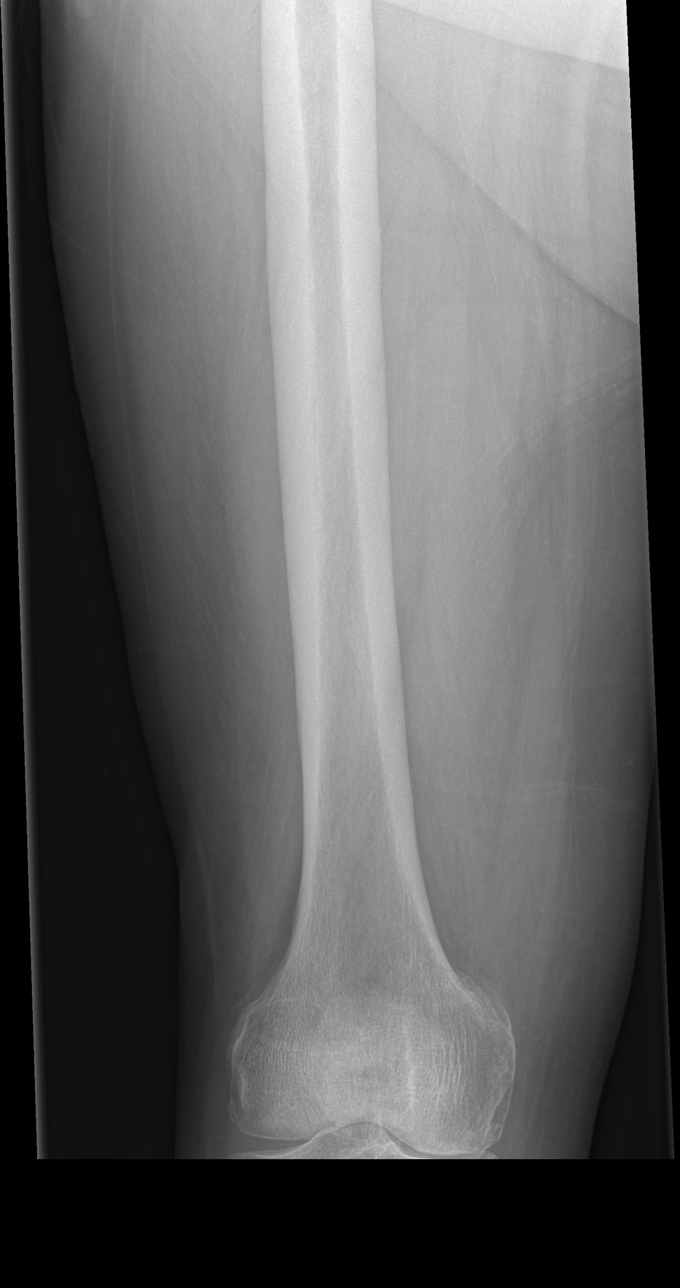

[t femur distal lat right]
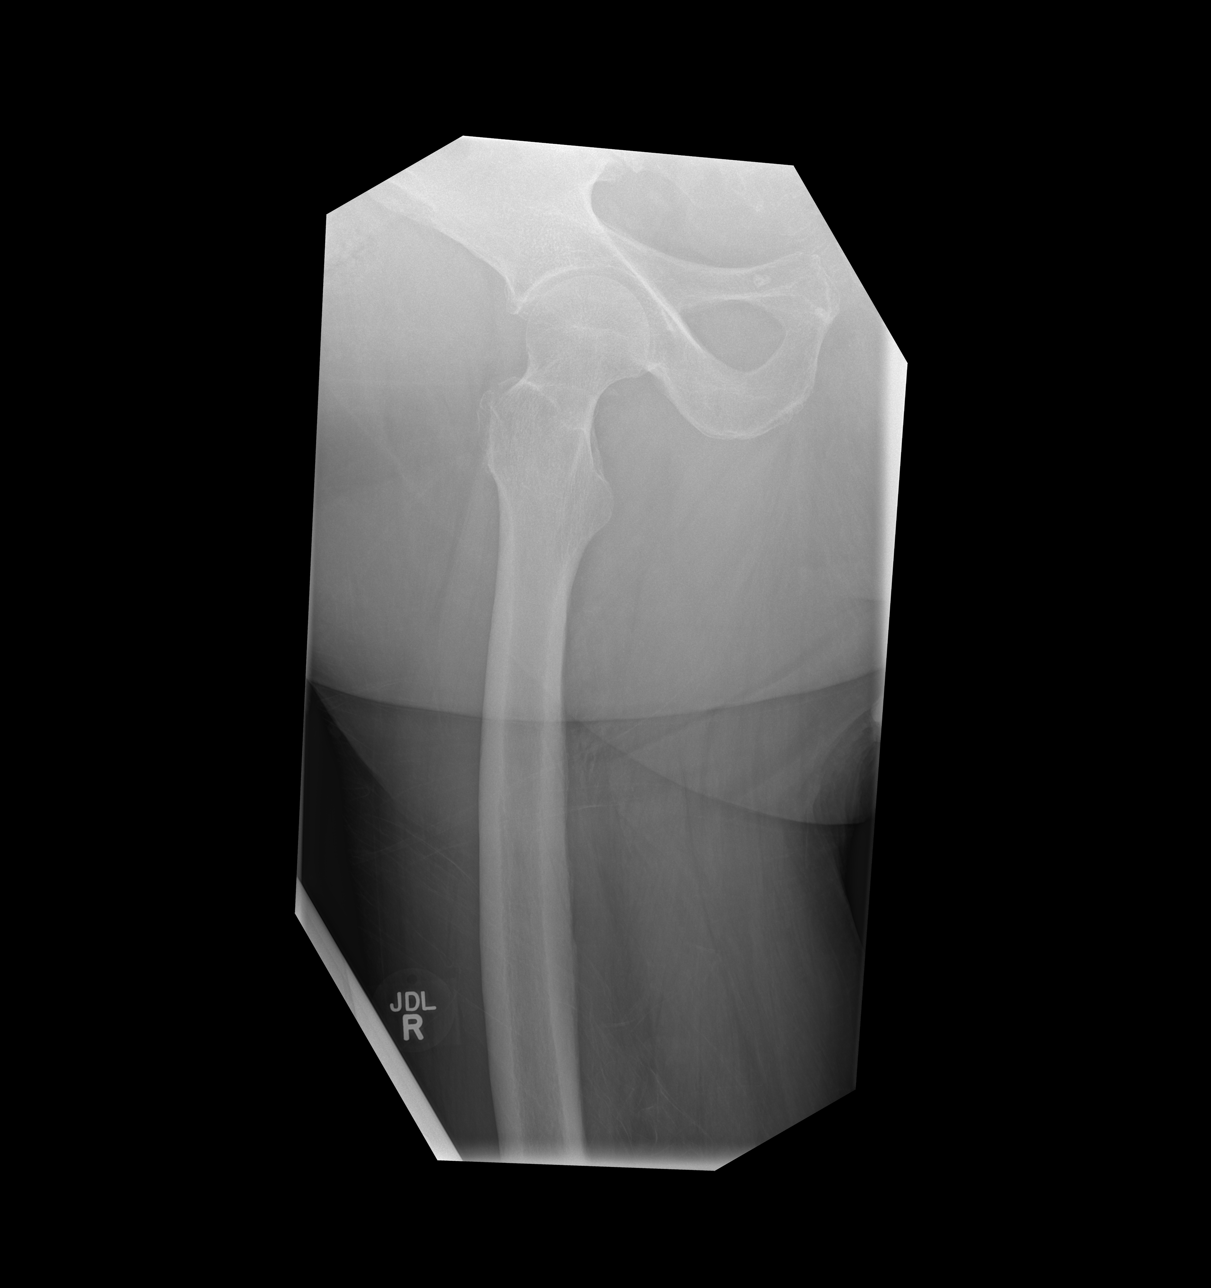

[t femur proximal lat right]
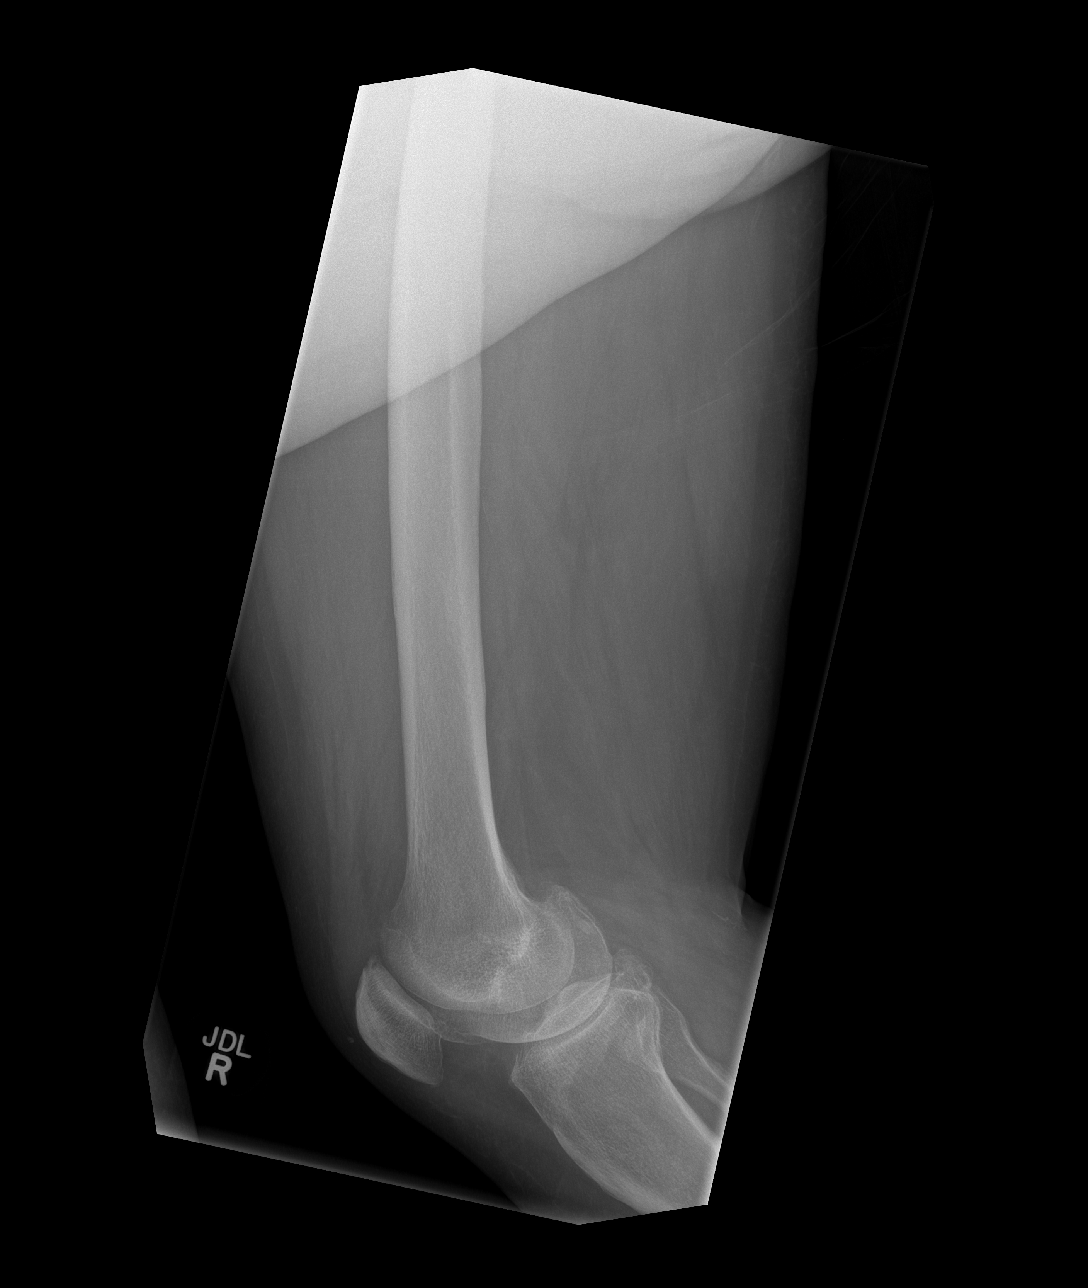

[4 of 4 positions shown; findings below may reference images not displayed]

FINDINGS: Knee osteoarthritis as detailed on dedicated films. No acute
fracture or dislocation.
IMPRESSION: No acute osseous abnormality.

## 2016-06-30 ENCOUNTER — Ambulatory Visit
Admission: RE | Admit: 2016-06-30 | Discharge: 2016-06-30 | Disposition: A | Discharge status: Home / Self Care | Payer: Medicaid Other | Source: Ambulatory Visit | Attending: Gastroenterology | Admitting: Gastroenterology

## 2016-06-30 ENCOUNTER — Other Ambulatory Visit: Payer: Self-pay | Admitting: Gastroenterology

## 2016-06-30 DIAGNOSIS — K5901 Slow transit constipation: Secondary | ICD-10-CM

## 2016-07-11 ENCOUNTER — Emergency Department (HOSPITAL_COMMUNITY)
Admission: EM | Admit: 2016-07-11 | Discharge: 2016-07-11 | Disposition: A | Discharge status: Home / Self Care | Payer: Medicaid Other | Attending: Emergency Medicine | Admitting: Emergency Medicine

## 2016-07-11 ENCOUNTER — Encounter (HOSPITAL_COMMUNITY): Payer: Self-pay | Admitting: Emergency Medicine

## 2016-07-11 DIAGNOSIS — M5442 Lumbago with sciatica, left side: Secondary | ICD-10-CM | POA: Diagnosis not present

## 2016-07-11 DIAGNOSIS — Z87891 Personal history of nicotine dependence: Secondary | ICD-10-CM | POA: Diagnosis not present

## 2016-07-11 DIAGNOSIS — Z7982 Long term (current) use of aspirin: Secondary | ICD-10-CM | POA: Diagnosis not present

## 2016-07-11 DIAGNOSIS — Z79899 Other long term (current) drug therapy: Secondary | ICD-10-CM | POA: Diagnosis not present

## 2016-07-11 DIAGNOSIS — I10 Essential (primary) hypertension: Secondary | ICD-10-CM | POA: Insufficient documentation

## 2016-07-11 DIAGNOSIS — M545 Low back pain: Secondary | ICD-10-CM | POA: Diagnosis present

## 2016-07-11 DIAGNOSIS — E119 Type 2 diabetes mellitus without complications: Secondary | ICD-10-CM | POA: Insufficient documentation

## 2016-07-11 MED ORDER — NAPROXEN 250 MG PO TABS
250.0000 mg | ORAL_TABLET | Freq: Two times a day (BID) | ORAL | 0 refills | Status: DC
Start: 2016-07-11 — End: 2017-08-23

## 2016-07-11 MED ORDER — ACETAMINOPHEN 325 MG PO TABS
650.0000 mg | ORAL_TABLET | Freq: Once | ORAL | Status: AC
  Administered 2016-07-11: 650 mg via ORAL
  Filled 2016-07-11: qty 2

## 2016-07-11 NOTE — ED Provider Notes (Signed)
[facility]-EMERGENCY DEPT Provider Note   CSN: [id number] Arrival date & time: 07/11/16  1432   By signing my name below, I, Christel MormonMatthew Jamison, attest that this documentation has been prepared under the direction and in the presence of Everlene FarrierWilliam Vaibhav Fogleman, PA-C. Electronically Signed: Christel MormonMatthew Jamison, Scribe. 07/11/2016. 3:36 PM.   History   Chief Complaint Chief Complaint  Patient presents with  . Leg Pain    The history is provided by the patient. No language interpreter was used.  HPI Comments:  Joel Silvasdwin Kamphuis is a 60 y.o. male with a PMHx of diabetes, hypercholesterolemia, and HTN who presents to the Emergency Department via EMS complaining of sudden onset, continuous, sharp left leg pain x 2 days. Pt says the pain starts in his left low back and radiates down his left leg.  He notes his pain is worse with movement. No falls or injury.  Pt is currently ambulatory with assistance of a walker. Pt denies fevers, falls, further injuries, bowel or bladder incontinence, dysuria, hematuria, numbness, tingling, weakness, abdominal pain, nausea, vomiting, or diarrhea. Pt further denies IV drug use or PMHx of cancer.    Past Medical History:  Diagnosis Date  . Degenerative disc disease   . Degenerative disc disease   . Diabetes mellitus   . GERD (gastroesophageal reflux disease)   . Hypercholesterolemia   . Hypertension   . Major depressive disorder (HCC)   . Psychosis     Patient Active Problem List   Diagnosis Date Noted  . HTN (hypertension) 09/10/2014  . Diabetes mellitus (HCC) 09/10/2014  . Chronic cholecystitis with calculus 09/09/2014  . 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 Symptomatic cholelithiasis 09/21/2012    Past Surgical History:  Procedure Laterality Date  . CHOLECYSTECTOMY N/A 09/09/2014   Procedure: LAPAROSCOPIC CHOLECYSTECTOMY WITH INTRAOPERATIVE CHOLANGIOGRAM;  Surgeon: Atilano InaEric M Wilson, MD;  Location: [facility];  Service: General;  Laterality:  N/A;  . cyst lower aboomen left     [place]  . EYE SURGERY  2003       Home Medications    Prior to Admission medications   Medication Sig Start Date End Date Taking? Authorizing Provider  acetaminophen (TYLENOL) 500 MG tablet Take 500 mg by mouth every 4 (four) hours as needed (for pain).    Historical Provider, MD  ARIPiprazole 300 MG SUSR Inject 300 mg into the muscle every 30 (thirty) days.    Historical Provider, MD  aspirin EC 81 MG tablet Take 81 mg by mouth daily.    Historical Provider, MD  clonazePAM (KLONOPIN) 0.5 MG tablet Take 0.5 mg by mouth 2 (two) times daily. scheduled    Historical Provider, MD  clonazePAM (KLONOPIN) 1 MG tablet Take 0.5 mg by mouth every 6 (six) hours as needed. For  anxiety    Historical Provider, MD  docusate sodium (COLACE) 100 MG capsule Take 100 mg by mouth daily.    Historical Provider, MD  DULoxetine (CYMBALTA) 60 MG capsule Take 60 mg by mouth daily.    Historical Provider, MD  ferrous sulfate 325 (65 FE) MG EC tablet Take 325 mg by mouth 2 (two) times daily with a meal.    Historical Provider, MD  l-methylfolate-B6-B12 (METANX) 3-35-2 MG TABS Take 0.5 tablets by mouth daily. Takes 1/2 tablet  (7.5 mg) by mouth daily    Historical Provider, MD  lurasidone (LATUDA) 80 MG TABS tablet Take 80 mg by mouth daily with breakfast.    Historical Provider, MD  magnesium  citrate SOLN Take 296 mLs (1 Bottle total) by mouth once. 10/06/14   Tomasita Crumble, MD  memantine (NAMENDA) 10 MG tablet Take 10 mg by mouth 2 (two) times daily.    Historical Provider, MD  naproxen (NAPROSYN) 250 MG tablet Take 1 tablet (250 mg total) by mouth 2 (two) times daily with a meal. 07/11/16   Everlene Farrier, PA-C  Omega-3 Fatty Acids (FISH OIL) 1000 MG CAPS Take 1 capsule by mouth.    Historical Provider, MD  omeprazole (PRILOSEC) 20 MG capsule Take 20 mg by mouth daily.    Historical Provider, MD  oxyCODONE-acetaminophen (PERCOCET/ROXICET) 5-325 MG per tablet Take 1-2  tablets by mouth 2 (two) times daily. Patient not taking: Reported on 03/22/2015 09/10/14   Gaynelle Adu, MD  polyethylene glycol [facility] Ethelene Hal) packet Take 17 g by mouth 2 (two) times daily as needed. 03/28/16   Raeford Razor, MD  polyethylene glycol powder (GLYCOLAX/MIRALAX) powder Take 17 g by mouth 2 (two) times daily. Until daily soft stools  OTC 07/26/14   Junius Finner, PA-C  promethazine (PHENERGAN) 25 MG tablet Take 1 tablet (25 mg total) by mouth every 6 (six) hours as needed for nausea or vomiting. 07/26/14   Junius Finner, PA-C  simvastatin (ZOCOR) 20 MG tablet Take 20 mg by mouth at bedtime.    Historical Provider, MD  trihexyphenidyl (ARTANE) 5 MG tablet Take 5 mg by mouth at bedtime.    Historical Provider, MD  zolpidem (AMBIEN) 5 MG tablet Take 5 mg by mouth at bedtime.     Historical Provider, MD    Family History Family History  Problem Relation Age of Onset  . Colon cancer Father   . Ovarian cancer Sister     Social History Social History  Substance Use Topics  . Smoking status: Former Smoker    Quit date: 09/21/1992  . Smokeless tobacco: Not on file  . Alcohol use No     Allergies   Review of patient's allergies indicates no known allergies.   Review of Systems Review of Systems  Constitutional: Negative for fever.  Cardiovascular: Negative for leg swelling.  Gastrointestinal: Negative for abdominal pain, diarrhea and vomiting.       Negative for bowel incontinence.  Genitourinary: Negative for dysuria and hematuria.       Negative for bladder incontinence.   Musculoskeletal: Positive for back pain. Negative for joint swelling and neck pain.  Skin: Negative for rash and wound.  Neurological: Negative for weakness and numbness.     Physical Exam Updated Vital Signs BP 113/79 (BP Location: Left Arm)   Pulse 83   Temp 98.7 F (37.1 C) (Oral)   Resp 16   Ht 5\' 10"  (1.778 m)   Wt 265 lb (120.2 kg)   SpO2 100%   BMI 38.02 kg/m   Physical Exam    Constitutional: He appears well-developed and well-nourished. No distress.  HENT:  Head: Normocephalic and atraumatic.  Eyes: Right eye exhibits no discharge. Left eye exhibits no discharge.  Cardiovascular: Normal rate, regular rhythm and intact distal pulses.   Pulmonary/Chest: Effort normal and breath sounds normal. No respiratory distress. He has no wheezes. He has no rales.  Abdominal: Soft. There is no tenderness. There is no guarding.  Musculoskeletal: Normal range of motion. He exhibits no edema (Back) or tenderness (midline back).  No back edema, erythema, ecchymosis, or warmth. No midline back tenderness.  Mild TTP over left lateral low back. No LE edema or tenderness. Good strength  to his bilateral LEs.   Neurological: He is alert. He has normal reflexes. He displays normal reflexes. Coordination normal.  Sensation is intact in his bilateral lower extremities. Able to ambulate with his walker, which is his normal.   Skin: Skin is warm and dry. Capillary refill takes less than 2 seconds. No rash noted. He is not diaphoretic. No erythema (Back). No pallor.  Psychiatric: He has a normal mood and affect. His behavior is normal.  Nursing note and vitals reviewed.    ED Treatments / Results  DIAGNOSTIC STUDIES:  Oxygen Saturation is 100% on RA, normal by my interpretation.    COORDINATION OF CARE:  3:20 PM Will order NSAID. Recommended back exercises. Discussed treatment plan with pt at bedside and pt agreed to plan.  Labs (all labs ordered are listed, but only abnormal results are displayed) Labs Reviewed - No data to display  EKG  EKG Interpretation None       Radiology No results found.  Procedures Procedures (including critical care time)  Medications Ordered in ED Medications  acetaminophen (TYLENOL) tablet 650 mg (not administered)     Initial Impression / Assessment and Plan / ED Course  I have reviewed the triage vital signs and the nursing  notes.  Pertinent labs & imaging results that were available during my care of the patient were reviewed by me and considered in my medical decision making (see chart for details).  Clinical Course   Patient presents with left low back pain that radiates down his left leg. Concern for sciatica-like pain.   No neurological deficits and normal neuro exam.  Patient can walk with walker, which is his normal.  No loss of bowel or bladder control.  No concern for cauda equina.  No fever, night sweats, weight loss, h/o cancer, IVDU.  RICE protocol and Naproxen pain medicine indicated and discussed with patient. I encouraged close follow-up with his primary care doctor. I discussed back exercises. I advised the patient to follow-up with their primary care provider this week. I advised the patient to return to the emergency department with new or worsening symptoms or new concerns. The patient verbalized understanding and agreement with plan.    I personally performed the services described in this documentation, which was scribed in my presence. The recorded information has been reviewed and is accurate.       Final Clinical Impressions(s) / ED Diagnoses   Final diagnoses:  Left-sided low back pain with left-sided sciatica    New Prescriptions New Prescriptions   NAPROXEN (NAPROSYN) 250 MG TABLET    Take 1 tablet (250 mg total) by mouth 2 (two) times daily with a meal.       Everlene FarrierWilliam Vincie Linn, PA-C 07/11/16 1550    Derwood KaplanAnkit Nanavati, MD 07/12/16 [phone]

## 2016-07-11 NOTE — ED Notes (Signed)
Bed: WA27 Expected date:  Expected time:  Means of arrival:  Comments: 60 yo

## 2016-07-11 NOTE — ED Triage Notes (Signed)
Pt is ambulatory with assistance of a walker.  [place] [employer] [place] and began havin sharp, continuous pain in left leg radiating up to lower back.

## 2016-07-11 NOTE — ED Notes (Signed)
[facility] notified for transport.  Pt will wait in WC in lobby for [facility]

## 2016-11-29 ENCOUNTER — Other Ambulatory Visit: Payer: Self-pay | Admitting: Cardiology

## 2016-11-29 DIAGNOSIS — R079 Chest pain, unspecified: Secondary | ICD-10-CM

## 2016-12-06 ENCOUNTER — Ambulatory Visit (HOSPITAL_COMMUNITY)
Admission: RE | Admit: 2016-12-06 | Discharge: 2016-12-06 | Disposition: A | Discharge status: Home / Self Care | Payer: Medicaid Other | Source: Ambulatory Visit | Attending: Cardiology | Admitting: Cardiology

## 2016-12-06 DIAGNOSIS — R079 Chest pain, unspecified: Secondary | ICD-10-CM | POA: Insufficient documentation

## 2016-12-06 MED ORDER — TECHNETIUM TC 99M TETROFOSMIN IV KIT
30.0000 | PACK | Freq: Once | INTRAVENOUS | Status: AC | PRN
  Administered 2016-12-06: 30 via INTRAVENOUS

## 2016-12-06 MED ORDER — REGADENOSON 0.4 MG/5ML IV SOLN
INTRAVENOUS | Status: AC
  Administered 2016-12-06: 0.4 mg via INTRAVENOUS
  Filled 2016-12-06: qty 5

## 2016-12-06 MED ORDER — TECHNETIUM TC 99M TETROFOSMIN IV KIT
10.0000 | PACK | Freq: Once | INTRAVENOUS | Status: AC | PRN
  Administered 2016-12-06: 10 via INTRAVENOUS

## 2016-12-06 MED ORDER — REGADENOSON 0.4 MG/5ML IV SOLN
0.4000 mg | Freq: Once | INTRAVENOUS | Status: AC
  Administered 2016-12-06: 0.4 mg via INTRAVENOUS

## 2017-07-18 ENCOUNTER — Encounter (HOSPITAL_COMMUNITY): Payer: Self-pay | Admitting: Emergency Medicine

## 2017-07-18 ENCOUNTER — Emergency Department (HOSPITAL_COMMUNITY)
Admission: EM | Admit: 2017-07-18 | Discharge: 2017-07-21 | Disposition: A | Discharge status: Other Acute Inpatient Hospital | Payer: Medicaid Other | Attending: Emergency Medicine | Admitting: Emergency Medicine

## 2017-07-18 DIAGNOSIS — Z87891 Personal history of nicotine dependence: Secondary | ICD-10-CM | POA: Diagnosis not present

## 2017-07-18 DIAGNOSIS — Z0000 Encounter for general adult medical examination without abnormal findings: Secondary | ICD-10-CM

## 2017-07-18 DIAGNOSIS — R45851 Suicidal ideations: Secondary | ICD-10-CM | POA: Diagnosis not present

## 2017-07-18 DIAGNOSIS — E119 Type 2 diabetes mellitus without complications: Secondary | ICD-10-CM | POA: Insufficient documentation

## 2017-07-18 DIAGNOSIS — I10 Essential (primary) hypertension: Secondary | ICD-10-CM | POA: Insufficient documentation

## 2017-07-18 DIAGNOSIS — Z79899 Other long term (current) drug therapy: Secondary | ICD-10-CM | POA: Diagnosis not present

## 2017-07-18 DIAGNOSIS — Z7982 Long term (current) use of aspirin: Secondary | ICD-10-CM | POA: Insufficient documentation

## 2017-07-18 DIAGNOSIS — F333 Major depressive disorder, recurrent, severe with psychotic symptoms: Secondary | ICD-10-CM | POA: Diagnosis present

## 2017-07-18 DIAGNOSIS — F329 Major depressive disorder, single episode, unspecified: Secondary | ICD-10-CM | POA: Diagnosis present

## 2017-07-18 LAB — RAPID URINE DRUG SCREEN, HOSP PERFORMED
Amphetamines: NOT DETECTED
BENZODIAZEPINES: NOT DETECTED
Barbiturates: NOT DETECTED
Cocaine: NOT DETECTED
OPIATES: NOT DETECTED
Tetrahydrocannabinol: NOT DETECTED

## 2017-07-18 LAB — COMPREHENSIVE METABOLIC PANEL
ALK PHOS: 62 U/L (ref 38–126)
ALT: 13 U/L — ABNORMAL LOW (ref 17–63)
AST: 17 U/L (ref 15–41)
Albumin: 4.4 g/dL (ref 3.5–5.0)
Anion gap: 11 (ref 5–15)
BUN: 19 mg/dL (ref 6–20)
CO2: 25 mmol/L (ref 22–32)
Calcium: 9.7 mg/dL (ref 8.9–10.3)
Chloride: 105 mmol/L (ref 101–111)
Creatinine, Ser: 1.36 mg/dL — ABNORMAL HIGH (ref 0.61–1.24)
GFR, EST NON AFRICAN AMERICAN: 54 mL/min — AB (ref >60)
Glucose, Bld: 96 mg/dL (ref 65–99)
POTASSIUM: 3.8 mmol/L (ref 3.5–5.1)
Sodium: 141 mmol/L (ref 135–145)
Total Bilirubin: 1.1 mg/dL (ref 0.3–1.2)
Total Protein: 7.3 g/dL (ref 6.5–8.1)

## 2017-07-18 LAB — CBC
HCT: 41.8 % (ref 39.0–52.0)
Hemoglobin: 14.1 g/dL (ref 13.0–17.0)
MCH: 30.7 pg (ref 26.0–34.0)
MCHC: 33.7 g/dL (ref 30.0–36.0)
MCV: 91.1 fL (ref 78.0–100.0)
PLATELETS: 158 10*3/uL (ref 150–400)
RBC: 4.59 MIL/uL (ref 4.22–5.81)
RDW: 13.2 % (ref 11.5–15.5)
WBC: 8.1 10*3/uL (ref 4.0–10.5)

## 2017-07-18 LAB — SALICYLATE LEVEL: Salicylate Lvl: <7 mg/dL (ref 2.8–30.0)

## 2017-07-18 LAB — ETHANOL

## 2017-07-18 LAB — ACETAMINOPHEN LEVEL: Acetaminophen (Tylenol), Serum: <10 ug/mL — ABNORMAL LOW (ref 10–30)

## 2017-07-18 MED ORDER — ACETAMINOPHEN 500 MG PO TABS
500.0000 mg | ORAL_TABLET | Freq: Two times a day (BID) | ORAL | Status: DC | PRN
  Administered 2017-07-18 – 2017-07-20 (×2): 500 mg via ORAL
  Filled 2017-07-18 (×2): qty 1

## 2017-07-18 MED ORDER — PANTOPRAZOLE SODIUM 40 MG PO TBEC
40.0000 mg | DELAYED_RELEASE_TABLET | Freq: Every day | ORAL | Status: DC
  Administered 2017-07-18 – 2017-07-21 (×4): 40 mg via ORAL
  Filled 2017-07-18 (×4): qty 1

## 2017-07-18 MED ORDER — NAPROXEN 500 MG PO TABS
250.0000 mg | ORAL_TABLET | Freq: Two times a day (BID) | ORAL | Status: DC
  Administered 2017-07-18 – 2017-07-20 (×5): 250 mg via ORAL
  Administered 2017-07-21: 500 mg via ORAL
  Filled 2017-07-18 (×6): qty 1

## 2017-07-18 MED ORDER — MEMANTINE HCL 10 MG PO TABS
10.0000 mg | ORAL_TABLET | Freq: Two times a day (BID) | ORAL | Status: DC
  Administered 2017-07-18 – 2017-07-21 (×6): 10 mg via ORAL
  Filled 2017-07-18 (×6): qty 1

## 2017-07-18 MED ORDER — DOCUSATE SODIUM 100 MG PO CAPS
100.0000 mg | ORAL_CAPSULE | Freq: Every day | ORAL | Status: DC
  Administered 2017-07-18 – 2017-07-21 (×4): 100 mg via ORAL
  Filled 2017-07-18 (×3): qty 1

## 2017-07-18 MED ORDER — LURASIDONE HCL 20 MG PO TABS
60.0000 mg | ORAL_TABLET | Freq: Two times a day (BID) | ORAL | Status: DC
  Administered 2017-07-18 – 2017-07-20 (×5): 60 mg via ORAL
  Filled 2017-07-18 (×6): qty 3

## 2017-07-18 MED ORDER — DULOXETINE HCL 30 MG PO CPEP
90.0000 mg | ORAL_CAPSULE | Freq: Every day | ORAL | Status: DC
  Administered 2017-07-18 – 2017-07-21 (×4): 90 mg via ORAL
  Filled 2017-07-18 (×4): qty 3

## 2017-07-18 MED ORDER — ADULT MULTIVITAMIN W/MINERALS CH
1.0000 | ORAL_TABLET | Freq: Every day | ORAL | Status: DC
  Administered 2017-07-18 – 2017-07-21 (×4): 1 via ORAL
  Filled 2017-07-18 (×4): qty 1

## 2017-07-18 MED ORDER — CLONAZEPAM 0.5 MG PO TABS
0.5000 mg | ORAL_TABLET | Freq: Two times a day (BID) | ORAL | Status: DC
Start: 2017-07-18 — End: 2017-07-21
  Administered 2017-07-18 – 2017-07-21 (×6): 0.5 mg via ORAL
  Filled 2017-07-18 (×6): qty 1

## 2017-07-18 MED ORDER — L-METHYLFOLATE-B6-B12 3-35-2 MG PO TABS
0.5000 | ORAL_TABLET | Freq: Every day | ORAL | Status: DC
  Administered 2017-07-19 – 2017-07-20 (×2): 0.5 via ORAL
  Administered 2017-07-21: 1 via ORAL
  Filled 2017-07-18 (×3): qty 0.5

## 2017-07-18 MED ORDER — BENZTROPINE MESYLATE 1 MG PO TABS
1.0000 mg | ORAL_TABLET | Freq: Two times a day (BID) | ORAL | Status: DC
  Administered 2017-07-18 – 2017-07-19 (×2): 1 mg via ORAL
  Filled 2017-07-18 (×3): qty 1

## 2017-07-18 MED ORDER — SODIUM CHLORIDE 0.9 % IV BOLUS (SEPSIS)
500.0000 mL | Freq: Once | INTRAVENOUS | Status: AC
  Administered 2017-07-18: 500 mL via INTRAVENOUS

## 2017-07-18 MED ORDER — ZOLPIDEM TARTRATE 5 MG PO TABS
5.0000 mg | ORAL_TABLET | Freq: Every day | ORAL | Status: DC
  Administered 2017-07-18 – 2017-07-20 (×3): 5 mg via ORAL
  Filled 2017-07-18 (×3): qty 1

## 2017-07-18 MED ORDER — SODIUM CHLORIDE 0.9 % IV SOLN
INTRAVENOUS | Status: DC
  Administered 2017-07-18: 17:00:00 via INTRAVENOUS

## 2017-07-18 MED ORDER — ASPIRIN EC 81 MG PO TBEC
81.0000 mg | DELAYED_RELEASE_TABLET | Freq: Every day | ORAL | Status: DC
  Administered 2017-07-19 – 2017-07-21 (×3): 81 mg via ORAL
  Filled 2017-07-18 (×3): qty 1

## 2017-07-18 MED ORDER — TRIHEXYPHENIDYL HCL 5 MG PO TABS
5.0000 mg | ORAL_TABLET | Freq: Every day | ORAL | Status: DC
  Administered 2017-07-18: 5 mg via ORAL
  Filled 2017-07-18: qty 1

## 2017-07-18 MED ORDER — SIMVASTATIN 20 MG PO TABS
20.0000 mg | ORAL_TABLET | Freq: Every day | ORAL | Status: DC
  Administered 2017-07-18 – 2017-07-20 (×3): 20 mg via ORAL
  Filled 2017-07-18 (×3): qty 1
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
## 2017-07-18 NOTE — Progress Notes (Signed)
Patient ambulated 26ft with front wheel rolling walker. Patient ambulated well with standby assist from staff member. Will continue to monitor patient.

## 2017-07-18 NOTE — ED Notes (Signed)
Report given.
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
## 2017-07-18 NOTE — BH Assessment (Signed)
[facility] Assessment Progress Note   Case was staffed with Lord DNP who recommended patient be re-evaluated in the a.m.    

## 2017-07-18 NOTE — ED Notes (Signed)
Bed: VO53 Expected date:  Expected time:  Means of arrival:  Comments: [employer]PD-medical clearance
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
## 2017-07-19 ENCOUNTER — Emergency Department (HOSPITAL_COMMUNITY): Payer: Medicaid Other

## 2017-07-19 DIAGNOSIS — F333 Major depressive disorder, recurrent, severe with psychotic symptoms: Secondary | ICD-10-CM

## 2017-07-19 DIAGNOSIS — Z87891 Personal history of nicotine dependence: Secondary | ICD-10-CM

## 2017-07-19 DIAGNOSIS — Z79899 Other long term (current) drug therapy: Secondary | ICD-10-CM

## 2017-07-19 LAB — URINALYSIS, ROUTINE W REFLEX MICROSCOPIC
BILIRUBIN URINE: NEGATIVE
Glucose, UA: NEGATIVE mg/dL
HGB URINE DIPSTICK: NEGATIVE
KETONES UR: NEGATIVE mg/dL
Leukocytes, UA: NEGATIVE
NITRITE: NEGATIVE
PROTEIN: NEGATIVE mg/dL
pH: 6.5 (ref 5.0–8.0)

## 2017-07-19 MED ORDER — TRIHEXYPHENIDYL HCL 5 MG PO TABS
5.0000 mg | ORAL_TABLET | Freq: Two times a day (BID) | ORAL | Status: DC
  Administered 2017-07-19 – 2017-07-21 (×4): 5 mg via ORAL
  Filled 2017-07-19 (×4): qty 1

## 2017-07-19 NOTE — BH Assessment (Signed)
[facility] Assessment Progress Note  Per Thedore Mins, MD, this pt requires psychiatric hospitalization at this time.  This patient is currently under voluntary status.  The following facilities have been contacted to seek placement for this pt, with results as noted:  Beds available, information sent, decision pending:  [facility]   Declined:  [facility] (not on pt's insurance panel)   At capacity:  [facility] (no male geriatric beds) [facility]    Doylene Canning, [place] Triage Specialist [phone]
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
## 2017-07-20 NOTE — ED Notes (Signed)
Patient sleeping.  Woke him up to give medications.

## 2017-07-20 NOTE — Progress Notes (Addendum)
CS[facility] and will follow up with a confirmation call (phone: [phone]; fax: [phone]; alternative fax: [phone]).  CSW called and spoke to Banner ElkJennifer who confirmed she received the IVC paperwork and pt can be admitted.    CSW placed IVC paperwork back in chart.  Dorothe PeaJonathan F. Latashia Koch, LCSWA, Darcey NoraLCAS, CSI Clinical Social Worker Ph: [phone]

## 2017-07-20 NOTE — ED Notes (Signed)
Pt provided Sprite Zero.
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
## 2017-07-20 NOTE — ED Notes (Signed)
Called report to [facility] medical center  [facility] with nurse jennifer @ [phone] ( currently report called @ 10:40pm 07/20/17 Nurse informed to expect a morning transfer to room 408 ( by sheriffs office IVC transport), ( too late for night transfer.  Accepting Physic an dr. Joseph Art Clinical social worker [facility] Dorothe Pea, Riffey  LCSWA, LCAS CSI ( contact # [phone])

## 2017-07-20 NOTE — ED Notes (Signed)
Patient asking for more medication.  Reports he is hearing voices.  Nurse reminded patient that it had not been long since his morning medications were given and to wait awhile and see if those medications were effective.

## 2017-07-20 NOTE — ED Notes (Signed)
Patient resting in bed   Calm and cooperative

## 2017-07-20 NOTE — Progress Notes (Signed)
CSW received a call from [facility] Pt has been accepted by: [facility] Number for report is: [phone] Pt's unit/room/bed number will be: Room 408 Accepting physician: Dr. Joseph Art  Pt can arrive ASAP on 8/29 or anytime on 07/21/17.  CSW will update RN and EDP.  Dorothe Pea. Shaterrica Territo, Francesco Sor, CSI Clinical Social Worker Ph: [phone]

## 2017-07-20 NOTE — BH Assessment (Signed)
[facility] Assessment Progress Note  Per Thedore MinsMojeed Akintayo, MD, this pt continues to require psychiatric hospitalization at this time.  This patient is currently under voluntary status.  The following facilities have been contacted to seek placement for this pt, with results as noted:  Beds available, information sent, decision pending:  [facility]   At [facility]:  [facility]   Reka Wist, [place] Triage Specialist [phone]

## 2017-07-20 NOTE — BH Assessment (Signed)
[facility] Assessment Progress Note  Per Thedore MinsMojeed Akintayo, MD, this pt requires psychiatric hospitalization at this time.  At 14:37 Joel ShinerGrace calls from [facility] to report that pt has been accepted to their facility by Dr Joseph ArtSubedi contingent upon pt being placed under IVC.  Joel MeansJamison Lord, DNP concurs with this decision, as does EDP Raeford RazorStephen Kohut, MD, who has initiated IVC.  [facility], and at 16:11 Joel QuillMagistrate [name] confirms receipt.  As of this writing, service of Findings and Custody Order is pending.  Joel GriceJonathan Riffey, LCSW agrees to fax all IVC documents to [facility], and to follow up with a confirmation call (phone: [phone]; fax: [phone]; alternative fax: [phone]).  Pt's nurse, Joel BeechamCynthia, has been notified, and agrees to call report to [phone] when the time comes.  [facility]  Joel Canninghomas Maxen Rowland, MA Triage Specialist [phone]

## 2017-07-21 DIAGNOSIS — F333 Major depressive disorder, recurrent, severe with psychotic symptoms: Secondary | ICD-10-CM | POA: Insufficient documentation

## 2017-07-21 NOTE — ED Notes (Signed)
Transport called.

## 2017-07-25 DIAGNOSIS — R7989 Other specified abnormal findings of blood chemistry: Secondary | ICD-10-CM | POA: Insufficient documentation

## 2017-08-23 ENCOUNTER — Emergency Department (HOSPITAL_COMMUNITY)
Admission: EM | Admit: 2017-08-23 | Discharge: 2017-08-24 | Disposition: A | Discharge status: Home / Self Care | Payer: Medicaid Other | Attending: Emergency Medicine | Admitting: Emergency Medicine

## 2017-08-23 ENCOUNTER — Encounter (HOSPITAL_COMMUNITY): Payer: Self-pay | Admitting: Emergency Medicine

## 2017-08-23 DIAGNOSIS — E119 Type 2 diabetes mellitus without complications: Secondary | ICD-10-CM | POA: Diagnosis not present

## 2017-08-23 DIAGNOSIS — Y999 Unspecified external cause status: Secondary | ICD-10-CM | POA: Diagnosis not present

## 2017-08-23 DIAGNOSIS — M25552 Pain in left hip: Secondary | ICD-10-CM | POA: Insufficient documentation

## 2017-08-23 DIAGNOSIS — W06XXXA Fall from bed, initial encounter: Secondary | ICD-10-CM | POA: Diagnosis not present

## 2017-08-23 DIAGNOSIS — Y92122 Bedroom in nursing home as the place of occurrence of the external cause: Secondary | ICD-10-CM | POA: Diagnosis not present

## 2017-08-23 DIAGNOSIS — R440 Auditory hallucinations: Secondary | ICD-10-CM | POA: Diagnosis present

## 2017-08-23 DIAGNOSIS — Z7982 Long term (current) use of aspirin: Secondary | ICD-10-CM | POA: Insufficient documentation

## 2017-08-23 DIAGNOSIS — Z79899 Other long term (current) drug therapy: Secondary | ICD-10-CM | POA: Insufficient documentation

## 2017-08-23 DIAGNOSIS — Y939 Activity, unspecified: Secondary | ICD-10-CM | POA: Diagnosis not present

## 2017-08-23 DIAGNOSIS — I10 Essential (primary) hypertension: Secondary | ICD-10-CM | POA: Diagnosis not present

## 2017-08-23 DIAGNOSIS — M25562 Pain in left knee: Secondary | ICD-10-CM | POA: Diagnosis not present

## 2017-08-23 HISTORY — DX: Personal history of other diseases of the musculoskeletal system and connective tissue: Z87.39

## 2017-08-23 HISTORY — DX: Anemia, unspecified: D64.9

## 2017-08-23 HISTORY — DX: Chronic kidney disease, unspecified: N18.9

## 2017-08-23 HISTORY — DX: Unspecified osteoarthritis, unspecified site: M19.90

## 2017-08-23 HISTORY — DX: Atherosclerotic heart disease of native coronary artery without angina pectoris: I25.10

## 2017-08-23 HISTORY — DX: Vitamin D deficiency, unspecified: E55.9

## 2017-08-23 LAB — URINALYSIS, ROUTINE W REFLEX MICROSCOPIC
Bilirubin Urine: NEGATIVE
GLUCOSE, UA: NEGATIVE mg/dL
Hgb urine dipstick: NEGATIVE
Ketones, ur: NEGATIVE mg/dL
LEUKOCYTES UA: NEGATIVE
Nitrite: NEGATIVE
PH: 5 (ref 5.0–8.0)
Protein, ur: NEGATIVE mg/dL
SPECIFIC GRAVITY, URINE: 1.011 (ref 1.005–1.030)

## 2017-08-23 NOTE — ED Notes (Signed)
Bed: ZO10 Expected date:  Expected time:  Means of arrival:  Comments: EMS  Medical clearance from SNF
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
## 2017-08-24 ENCOUNTER — Emergency Department (HOSPITAL_COMMUNITY): Payer: Medicaid Other

## 2017-08-24 LAB — CBC WITH DIFFERENTIAL/PLATELET
Basophils Absolute: 0 10*3/uL (ref 0.0–0.1)
Basophils Relative: 0 %
EOS PCT: 3 %
Eosinophils Absolute: 0.2 10*3/uL (ref 0.0–0.7)
HEMATOCRIT: 37.8 % — AB (ref 39.0–52.0)
HEMOGLOBIN: 12.8 g/dL — AB (ref 13.0–17.0)
LYMPHS ABS: 2 10*3/uL (ref 0.7–4.0)
LYMPHS PCT: 31 %
MCH: 31.6 pg (ref 26.0–34.0)
MCHC: 33.9 g/dL (ref 30.0–36.0)
MCV: 93.3 fL (ref 78.0–100.0)
Monocytes Absolute: 0.4 10*3/uL (ref 0.1–1.0)
Monocytes Relative: 6 %
NEUTROS ABS: 3.8 10*3/uL (ref 1.7–7.7)
NEUTROS PCT: 60 %
Platelets: 145 10*3/uL — ABNORMAL LOW (ref 150–400)
RBC: 4.05 MIL/uL — AB (ref 4.22–5.81)
RDW: 13.4 % (ref 11.5–15.5)
WBC: 6.4 10*3/uL (ref 4.0–10.5)

## 2017-08-24 LAB — RAPID URINE DRUG SCREEN, HOSP PERFORMED
Amphetamines: NOT DETECTED
BARBITURATES: NOT DETECTED
BENZODIAZEPINES: NOT DETECTED
Cocaine: NOT DETECTED
Opiates: NOT DETECTED
Tetrahydrocannabinol: NOT DETECTED

## 2017-08-24 LAB — COMPREHENSIVE METABOLIC PANEL
ALT: 13 U/L — AB (ref 17–63)
AST: 16 U/L (ref 15–41)
Albumin: 3.9 g/dL (ref 3.5–5.0)
Alkaline Phosphatase: 60 U/L (ref 38–126)
Anion gap: 10 (ref 5–15)
BUN: 20 mg/dL (ref 6–20)
CHLORIDE: 107 mmol/L (ref 101–111)
CO2: 23 mmol/L (ref 22–32)
CREATININE: 1.18 mg/dL (ref 0.61–1.24)
Calcium: 9.2 mg/dL (ref 8.9–10.3)
GFR calc non Af Amer: >60 mL/min (ref >60)
Glucose, Bld: 153 mg/dL — ABNORMAL HIGH (ref 65–99)
POTASSIUM: 3.3 mmol/L — AB (ref 3.5–5.1)
SODIUM: 140 mmol/L (ref 135–145)
Total Bilirubin: 0.8 mg/dL (ref 0.3–1.2)
Total Protein: 6.7 g/dL (ref 6.5–8.1)

## 2017-08-24 LAB — ETHANOL: Alcohol, Ethyl (B): <10 mg/dL (ref <10)

## 2017-08-24 MED ORDER — DOCUSATE SODIUM 100 MG PO CAPS
100.0000 mg | ORAL_CAPSULE | Freq: Every day | ORAL | Status: DC
  Administered 2017-08-24: 100 mg via ORAL
  Filled 2017-08-24: qty 1

## 2017-08-24 MED ORDER — MEMANTINE HCL 10 MG PO TABS
10.0000 mg | ORAL_TABLET | Freq: Two times a day (BID) | ORAL | Status: DC
  Administered 2017-08-24: 10 mg via ORAL
  Filled 2017-08-24: qty 1

## 2017-08-24 MED ORDER — LURASIDONE HCL 60 MG PO TABS
60.0000 mg | ORAL_TABLET | Freq: Two times a day (BID) | ORAL | Status: DC
  Administered 2017-08-24: 60 mg via ORAL
  Filled 2017-08-24: qty 1

## 2017-08-24 MED ORDER — ACETAMINOPHEN 325 MG PO TABS
650.0000 mg | ORAL_TABLET | Freq: Once | ORAL | Status: AC
  Administered 2017-08-24: 650 mg via ORAL
  Filled 2017-08-24: qty 2

## 2017-08-24 MED ORDER — DULOXETINE HCL 30 MG PO CPEP
90.0000 mg | ORAL_CAPSULE | Freq: Every day | ORAL | Status: DC
  Administered 2017-08-24: 90 mg via ORAL
  Filled 2017-08-24: qty 3

## 2017-08-24 MED ORDER — ASPIRIN EC 81 MG PO TBEC
81.0000 mg | DELAYED_RELEASE_TABLET | Freq: Every day | ORAL | Status: DC
  Administered 2017-08-24: 81 mg via ORAL
  Filled 2017-08-24: qty 1

## 2017-08-24 MED ORDER — CLONAZEPAM 0.5 MG PO TABS
0.5000 mg | ORAL_TABLET | Freq: Two times a day (BID) | ORAL | Status: DC
  Administered 2017-08-24: 0.5 mg via ORAL
  Filled 2017-08-24 (×2): qty 1

## 2017-08-24 MED ORDER — PANTOPRAZOLE SODIUM 40 MG PO TBEC
40.0000 mg | DELAYED_RELEASE_TABLET | Freq: Every day | ORAL | Status: DC
  Administered 2017-08-24: 40 mg via ORAL
  Filled 2017-08-24: qty 1

## 2017-08-24 MED ORDER — BENZTROPINE MESYLATE 0.5 MG PO TABS
0.5000 mg | ORAL_TABLET | Freq: Two times a day (BID) | ORAL | Status: DC
  Administered 2017-08-24: 0.5 mg via ORAL
  Filled 2017-08-24 (×2): qty 1
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
## 2017-08-24 NOTE — ED Provider Notes (Signed)
Pt was handed off to me by previous EDPA McDonald at shift change pending AM psych evaluation. Please see previous EDPA note for fur HPI, ROS and exam.  Plan is pending on psych recommendations.   Pt continues to request discharge. He does not want to stay for psych evaluation. I talked to psych doctor who cannot give me a time of when he will be able to see pt.  Pt has been in ED for 11 hours. On exam, VS are WNL. Lungs CTAB. RRR. Pt A&O x 4. Pt is ambulating in room and hall w/o difficulty. He denies active hallucinations, SI, HI. States he often hears his niece and her boyfriend's voices for the last 2 years. Chart review reveals EMS reported pt was agitated and shouting HI/SI statements at nursing home, however pt denies this.   Given lack of active HI, SI, AVH and long h/o of auditory hallucinations I don't think pt requires IVC today. Discussed pt with SP who agrees with discharge plan.    Liberty Handy, PA-C 08/24/17 0940    Gerhard Munch, MD 08/24/17 1501

## 2017-08-24 NOTE — ED Notes (Signed)
PTAR called  

## 2017-08-24 NOTE — ED Notes (Signed)
Tried to call report again voicemail left again.
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
## 2017-08-24 NOTE — ED Notes (Signed)
Attempted to call report to [facility]. Message left about patient pending discharge. Second called someone answered and said something came up and will call back.

## 2018-01-18 ENCOUNTER — Emergency Department (HOSPITAL_COMMUNITY)
Admission: EM | Admit: 2018-01-18 | Discharge: 2018-01-19 | Disposition: A | Discharge status: Other Acute Inpatient Hospital | Payer: Medicaid Other | Attending: Emergency Medicine | Admitting: Emergency Medicine

## 2018-01-18 ENCOUNTER — Encounter (HOSPITAL_COMMUNITY): Payer: Self-pay | Admitting: Emergency Medicine

## 2018-01-18 DIAGNOSIS — Z008 Encounter for other general examination: Secondary | ICD-10-CM | POA: Diagnosis present

## 2018-01-18 DIAGNOSIS — Z79899 Other long term (current) drug therapy: Secondary | ICD-10-CM | POA: Insufficient documentation

## 2018-01-18 DIAGNOSIS — F341 Dysthymic disorder: Secondary | ICD-10-CM | POA: Insufficient documentation

## 2018-01-18 DIAGNOSIS — R51 Headache: Secondary | ICD-10-CM | POA: Diagnosis not present

## 2018-01-18 DIAGNOSIS — R45851 Suicidal ideations: Secondary | ICD-10-CM | POA: Diagnosis not present

## 2018-01-18 DIAGNOSIS — I2510 Atherosclerotic heart disease of native coronary artery without angina pectoris: Secondary | ICD-10-CM | POA: Insufficient documentation

## 2018-01-18 DIAGNOSIS — Z87891 Personal history of nicotine dependence: Secondary | ICD-10-CM | POA: Insufficient documentation

## 2018-01-18 DIAGNOSIS — I10 Essential (primary) hypertension: Secondary | ICD-10-CM | POA: Diagnosis not present

## 2018-01-18 DIAGNOSIS — F251 Schizoaffective disorder, depressive type: Secondary | ICD-10-CM | POA: Diagnosis not present

## 2018-01-18 DIAGNOSIS — Z7982 Long term (current) use of aspirin: Secondary | ICD-10-CM | POA: Insufficient documentation

## 2018-01-18 DIAGNOSIS — E119 Type 2 diabetes mellitus without complications: Secondary | ICD-10-CM | POA: Insufficient documentation

## 2018-01-18 DIAGNOSIS — F329 Major depressive disorder, single episode, unspecified: Secondary | ICD-10-CM

## 2018-01-18 LAB — COMPREHENSIVE METABOLIC PANEL
ALT: 15 U/L — AB (ref 17–63)
AST: 17 U/L (ref 15–41)
Albumin: 4.1 g/dL (ref 3.5–5.0)
Alkaline Phosphatase: 65 U/L (ref 38–126)
Anion gap: 13 (ref 5–15)
BILIRUBIN TOTAL: 0.9 mg/dL (ref 0.3–1.2)
BUN: 11 mg/dL (ref 6–20)
CO2: 25 mmol/L (ref 22–32)
CREATININE: 1.14 mg/dL (ref 0.61–1.24)
Calcium: 9.4 mg/dL (ref 8.9–10.3)
Chloride: 101 mmol/L (ref 101–111)
Glucose, Bld: 100 mg/dL — ABNORMAL HIGH (ref 65–99)
Potassium: 4.1 mmol/L (ref 3.5–5.1)
Sodium: 139 mmol/L (ref 135–145)
TOTAL PROTEIN: 7.1 g/dL (ref 6.5–8.1)

## 2018-01-18 LAB — CBC
HCT: 44.2 % (ref 39.0–52.0)
Hemoglobin: 14.3 g/dL (ref 13.0–17.0)
MCH: 30.3 pg (ref 26.0–34.0)
MCHC: 32.4 g/dL (ref 30.0–36.0)
MCV: 93.6 fL (ref 78.0–100.0)
PLATELETS: 166 10*3/uL (ref 150–400)
RBC: 4.72 MIL/uL (ref 4.22–5.81)
RDW: 13.7 % (ref 11.5–15.5)
WBC: 7.2 10*3/uL (ref 4.0–10.5)

## 2018-01-18 LAB — RAPID URINE DRUG SCREEN, HOSP PERFORMED
Amphetamines: NOT DETECTED
Barbiturates: NOT DETECTED
Benzodiazepines: NOT DETECTED
Cocaine: NOT DETECTED
OPIATES: NOT DETECTED
Tetrahydrocannabinol: NOT DETECTED

## 2018-01-18 LAB — ACETAMINOPHEN LEVEL: Acetaminophen (Tylenol), Serum: <10 ug/mL — ABNORMAL LOW (ref 10–30)

## 2018-01-18 LAB — ETHANOL

## 2018-01-18 LAB — SALICYLATE LEVEL

## 2018-01-18 LAB — CBG MONITORING, ED: Glucose-Capillary: 73 mg/dL (ref 65–99)

## 2018-01-18 MED ORDER — DOCUSATE SODIUM 100 MG PO CAPS
100.0000 mg | ORAL_CAPSULE | Freq: Every day | ORAL | Status: DC
  Administered 2018-01-18: 100 mg via ORAL
  Filled 2018-01-18: qty 1

## 2018-01-18 MED ORDER — ZOLPIDEM TARTRATE 5 MG PO TABS
5.0000 mg | ORAL_TABLET | Freq: Every day | ORAL | Status: DC
Start: 2018-01-18 — End: 2018-01-19
  Administered 2018-01-18: 5 mg via ORAL
  Filled 2018-01-18: qty 1

## 2018-01-18 MED ORDER — TRIHEXYPHENIDYL HCL 5 MG PO TABS
5.0000 mg | ORAL_TABLET | Freq: Every day | ORAL | Status: DC
  Administered 2018-01-18: 5 mg via ORAL
  Filled 2018-01-18: qty 1

## 2018-01-18 MED ORDER — CLONAZEPAM 0.5 MG PO TABS
1.0000 mg | ORAL_TABLET | Freq: Three times a day (TID) | ORAL | Status: DC | PRN
  Administered 2018-01-18: 1 mg via ORAL
  Filled 2018-01-18: qty 2

## 2018-01-18 MED ORDER — BENZTROPINE MESYLATE 1 MG PO TABS
0.5000 mg | ORAL_TABLET | Freq: Two times a day (BID) | ORAL | Status: DC
  Administered 2018-01-18: 0.5 mg via ORAL
  Filled 2018-01-18: qty 1

## 2018-01-18 MED ORDER — L-METHYLFOLATE-B6-B12 3-35-2 MG PO TABS
0.5000 | ORAL_TABLET | Freq: Every day | ORAL | Status: DC
  Filled 2018-01-18: qty 0.5

## 2018-01-18 MED ORDER — SIMVASTATIN 20 MG PO TABS
20.0000 mg | ORAL_TABLET | Freq: Every day | ORAL | Status: DC
  Administered 2018-01-18: 20 mg via ORAL
  Filled 2018-01-18: qty 1

## 2018-01-18 MED ORDER — ACETAMINOPHEN 325 MG PO TABS
650.0000 mg | ORAL_TABLET | ORAL | Status: DC | PRN
  Administered 2018-01-18: 650 mg via ORAL
  Filled 2018-01-18: qty 2

## 2018-01-18 MED ORDER — LURASIDONE HCL 60 MG PO TABS
60.0000 mg | ORAL_TABLET | Freq: Two times a day (BID) | ORAL | Status: DC
  Administered 2018-01-18: 60 mg via ORAL
  Filled 2018-01-18 (×2): qty 1

## 2018-01-18 MED ORDER — PROMETHAZINE HCL 25 MG PO TABS: 25.0000 mg | ORAL_TABLET | Freq: Four times a day (QID) | ORAL | Status: DC | PRN

## 2018-01-18 MED ORDER — MEMANTINE HCL 10 MG PO TABS
10.0000 mg | ORAL_TABLET | Freq: Two times a day (BID) | ORAL | Status: DC
  Administered 2018-01-18: 10 mg via ORAL
  Filled 2018-01-18: qty 1

## 2018-01-18 MED ORDER — POLYVINYL ALCOHOL 1.4 % OP SOLN
1.0000 [drp] | Freq: Every day | OPHTHALMIC | Status: DC
  Administered 2018-01-18: 1 [drp] via OPHTHALMIC
  Filled 2018-01-18: qty 15

## 2018-01-18 MED ORDER — ASPIRIN EC 81 MG PO TBEC
81.0000 mg | DELAYED_RELEASE_TABLET | Freq: Every day | ORAL | Status: DC
  Administered 2018-01-18: 81 mg via ORAL
  Filled 2018-01-18: qty 1

## 2018-01-18 MED ORDER — ADULT MULTIVITAMIN W/MINERALS CH
1.0000 | ORAL_TABLET | Freq: Every day | ORAL | Status: DC
  Administered 2018-01-18: 1 via ORAL
  Filled 2018-01-18: qty 1

## 2018-01-18 MED ORDER — DULOXETINE HCL 60 MG PO CPEP
90.0000 mg | ORAL_CAPSULE | Freq: Every day | ORAL | Status: DC
  Administered 2018-01-18: 19:00:00 90 mg via ORAL
  Filled 2018-01-18: qty 1

## 2018-01-18 NOTE — ED Notes (Signed)
TTS recommendation is for pt to go to inpatient [facility] Tx.

## 2018-01-18 NOTE — ED Notes (Signed)
Belongings placed in locker 11 

## 2018-01-18 NOTE — ED Notes (Signed)
Pt care assumed, obtained verbal report.  Pt is resting and appears comfortable.  Sitter at bedside.

## 2018-01-18 NOTE — Progress Notes (Signed)
Belenda CruiseKristin with [employer] called writer and inquired if patient would be able to pay for IP treatment out of pocket since he didn't have medicare.   Over 1500 per day and [employer] would need pt to pay 3 days in advance  Spoke with patient who informed that: "No. I'm in the assisted living".  Contacted [facility] and advised of decline for treatment due to financial reason.  Melbourne Abtsatia Zi Newbury, MSW, LCSWA Clinical social worker in disposition [facility], TTS Office [phone] and [phone] 01/18/2018 8:24 PM

## 2018-01-18 NOTE — Progress Notes (Signed)
Per Assunta FoundShuvon Rankin, NP pt meets criteria for geropsych treatment. EDP Pricilla LovelessGoldston, Scott, MD and pt's nurse have been advised of the disposition.   [name], MSW, LCSW Therapeutic Triage Specialist  [phone]

## 2018-01-18 NOTE — ED Notes (Signed)
Called staffing for sitter.  Patient being changed at this time

## 2018-01-18 NOTE — ED Notes (Signed)
Pt ambulated to the BR with his walker

## 2018-01-18 NOTE — ED Notes (Signed)
Pt reports L knee pain, rates it at 8/10.

## 2018-01-18 NOTE — Progress Notes (Signed)
Patient meets criteria for geriatric inpatient treatment and has been referred to the following facilities: [facility], [facility], [facility], [facility], [facility], [facility], [facility], [facility], [facility], and [facility].  CSW in disposition will continue to seek placement.  Mel[place] Abtsatia Darald Uzzle, MSW, LCSWA Clinical social worker in disposition [facility], TTS Office [phone] and [phone] 01/18/2018 8:02 PM

## 2018-01-18 NOTE — ED Notes (Signed)
Pt ambulated to the BR with a walker without difficulty.
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
## 2018-01-19 DIAGNOSIS — F29 Unspecified psychosis not due to a substance or known physiological condition: Secondary | ICD-10-CM | POA: Insufficient documentation

## 2018-01-19 NOTE — ED Notes (Signed)
Pt ambulatory to hallway bathroom with walker and standby assist only.

## 2018-01-19 NOTE — ED Notes (Signed)
[place] [facility]

## 2018-01-19 NOTE — ED Notes (Signed)
[facility] transported at this time.

## 2018-01-19 NOTE — Progress Notes (Signed)
[facility]   Dr. Ferd GlassingKommisarova is the accepting/attending provider.  Call report to [phone]  [email or web address]  ED Pt is Voluntary.  Pt may be transported by [facility]  [facility]  [facility] as [facility] can be arranged.  Timmothy EulerJean T. Kaylyn LimSutter, MSW, LCSWA Disposition Clinical Social Work [phone] (cell) [phone] (office)

## 2018-04-16 ENCOUNTER — Other Ambulatory Visit: Payer: Self-pay

## 2018-04-16 ENCOUNTER — Encounter (HOSPITAL_COMMUNITY): Payer: Self-pay | Admitting: Emergency Medicine

## 2018-04-16 ENCOUNTER — Emergency Department (HOSPITAL_COMMUNITY)
Admission: EM | Admit: 2018-04-16 | Discharge: 2018-04-17 | Disposition: A | Discharge status: Home / Self Care | Payer: Medicaid Other | Attending: Emergency Medicine | Admitting: Emergency Medicine

## 2018-04-16 DIAGNOSIS — R0789 Other chest pain: Secondary | ICD-10-CM | POA: Insufficient documentation

## 2018-04-16 DIAGNOSIS — R1030 Lower abdominal pain, unspecified: Secondary | ICD-10-CM | POA: Insufficient documentation

## 2018-04-16 DIAGNOSIS — Z79899 Other long term (current) drug therapy: Secondary | ICD-10-CM | POA: Insufficient documentation

## 2018-04-16 DIAGNOSIS — Z7982 Long term (current) use of aspirin: Secondary | ICD-10-CM | POA: Insufficient documentation

## 2018-04-16 DIAGNOSIS — E119 Type 2 diabetes mellitus without complications: Secondary | ICD-10-CM | POA: Diagnosis not present

## 2018-04-16 DIAGNOSIS — I2510 Atherosclerotic heart disease of native coronary artery without angina pectoris: Secondary | ICD-10-CM | POA: Diagnosis not present

## 2018-04-16 DIAGNOSIS — E7800 Pure hypercholesterolemia, unspecified: Secondary | ICD-10-CM | POA: Insufficient documentation

## 2018-04-16 DIAGNOSIS — Z87891 Personal history of nicotine dependence: Secondary | ICD-10-CM | POA: Diagnosis not present

## 2018-04-16 DIAGNOSIS — I10 Essential (primary) hypertension: Secondary | ICD-10-CM | POA: Diagnosis not present

## 2018-04-16 DIAGNOSIS — R200 Anesthesia of skin: Secondary | ICD-10-CM | POA: Diagnosis present

## 2018-04-16 NOTE — ED Triage Notes (Signed)
[facility] BIB [facility] from [facility] with c/o left arm numbness starting at 2200 tonight. No other neuro symptoms. Pt also c/o chest pain, worse with palpation. Facility reports this pain is not unusual and when pt takes medications it improves. VSS.

## 2018-04-17 ENCOUNTER — Emergency Department (HOSPITAL_COMMUNITY): Payer: Medicaid Other

## 2018-04-17 ENCOUNTER — Other Ambulatory Visit: Payer: Self-pay

## 2018-04-17 LAB — CBC WITH DIFFERENTIAL/PLATELET
ABS IMMATURE GRANULOCYTES: 0.1 10*3/uL (ref 0.0–0.1)
BASOS ABS: 0.1 10*3/uL (ref 0.0–0.1)
Basophils Relative: 1 %
EOS ABS: 0.2 10*3/uL (ref 0.0–0.7)
EOS PCT: 3 %
HCT: 41.3 % (ref 39.0–52.0)
Hemoglobin: 13.3 g/dL (ref 13.0–17.0)
IMMATURE GRANULOCYTES: 1 %
Lymphocytes Relative: 27 %
Lymphs Abs: 2 10*3/uL (ref 0.7–4.0)
MCH: 30.3 pg (ref 26.0–34.0)
MCHC: 32.2 g/dL (ref 30.0–36.0)
MCV: 94.1 fL (ref 78.0–100.0)
Monocytes Absolute: 0.5 10*3/uL (ref 0.1–1.0)
Monocytes Relative: 7 %
Neutro Abs: 4.7 10*3/uL (ref 1.7–7.7)
Neutrophils Relative %: 61 %
PLATELETS: ADEQUATE 10*3/uL (ref 150–400)
RBC: 4.39 MIL/uL (ref 4.22–5.81)
RDW: 13.2 % (ref 11.5–15.5)
WBC: 7.5 10*3/uL (ref 4.0–10.5)

## 2018-04-17 LAB — HEPATIC FUNCTION PANEL
ALBUMIN: 3.5 g/dL (ref 3.5–5.0)
ALT: 11 U/L — AB (ref 17–63)
AST: 19 U/L (ref 15–41)
Alkaline Phosphatase: 63 U/L (ref 38–126)
Bilirubin, Direct: 0.4 mg/dL (ref 0.1–0.5)
Indirect Bilirubin: 0.7 mg/dL (ref 0.3–0.9)
TOTAL PROTEIN: 6.2 g/dL — AB (ref 6.5–8.1)
Total Bilirubin: 1.1 mg/dL (ref 0.3–1.2)

## 2018-04-17 LAB — I-STAT TROPONIN, ED
TROPONIN I, POC: 0.01 ng/mL (ref 0.00–0.08)
Troponin i, poc: 0 ng/mL (ref 0.00–0.08)

## 2018-04-17 LAB — BASIC METABOLIC PANEL
ANION GAP: 9 (ref 5–15)
BUN: 17 mg/dL (ref 6–20)
CALCIUM: 8.9 mg/dL (ref 8.9–10.3)
CO2: 25 mmol/L (ref 22–32)
Chloride: 105 mmol/L (ref 101–111)
Creatinine, Ser: 0.91 mg/dL (ref 0.61–1.24)
Glucose, Bld: 115 mg/dL — ABNORMAL HIGH (ref 65–99)
Potassium: 4.6 mmol/L (ref 3.5–5.1)
Sodium: 139 mmol/L (ref 135–145)

## 2018-04-17 LAB — LIPASE, BLOOD: LIPASE: 41 U/L (ref 11–51)

## 2018-04-17 LAB — URINALYSIS, ROUTINE W REFLEX MICROSCOPIC
Bilirubin Urine: NEGATIVE
GLUCOSE, UA: NEGATIVE mg/dL
Hgb urine dipstick: NEGATIVE
Ketones, ur: NEGATIVE mg/dL
LEUKOCYTES UA: NEGATIVE
NITRITE: NEGATIVE
PROTEIN: NEGATIVE mg/dL
Specific Gravity, Urine: 1.004 — ABNORMAL LOW (ref 1.005–1.030)
pH: 7 (ref 5.0–8.0)

## 2018-04-17 MED ORDER — ONDANSETRON HCL 4 MG/2ML IJ SOLN
4.0000 mg | Freq: Once | INTRAMUSCULAR | Status: AC
  Administered 2018-04-17: 4 mg via INTRAVENOUS
  Filled 2018-04-17: qty 2

## 2018-04-17 MED ORDER — ONDANSETRON HCL 4 MG PO TABS: 4.0000 mg | ORAL_TABLET | Freq: Four times a day (QID) | ORAL | 0 refills | Status: DC | PRN

## 2018-04-17 MED ORDER — MORPHINE SULFATE (PF) 4 MG/ML IV SOLN
4.0000 mg | Freq: Once | INTRAVENOUS | Status: AC
Start: 2018-04-17 — End: 2018-04-17
  Administered 2018-04-17: 4 mg via INTRAVENOUS
  Filled 2018-04-17: qty 1

## 2018-04-17 MED ORDER — IOHEXOL 300 MG/ML  SOLN
100.0000 mL | Freq: Once | INTRAMUSCULAR | Status: AC | PRN
  Administered 2018-04-17: 100 mL via INTRAVENOUS

## 2018-04-17 MED ORDER — TRAMADOL HCL 50 MG PO TABS: 50.0000 mg | ORAL_TABLET | Freq: Four times a day (QID) | ORAL | 0 refills | Status: DC | PRN

## 2018-04-17 NOTE — ED Provider Notes (Signed)
[facility] EMERGENCY DEPARTMENT Provider Note   CSN: [id number] Arrival date & time: 04/16/18  2342     History   Chief Complaint Chief Complaint  Patient presents with  . Numbness  . Chest Pain    HPI [name] is a 62 y.o. male.   The history is provided by the patient and the nursing home.  He has history of diabetes, hypertension, hyperlipidemia, major depression and comes in complaining of numbness in his left arm and pain in his chest and lower abdomen.  He is a very vague and poor historian, but symptoms apparently started tonight.  Numbness in his arm has resolved.  He states his pain is sharp and he rates it at 9/10.  Nothing makes it better, nothing makes worse.  He denies dyspnea, nausea, diaphoresis.  Nursing facility noted chest tenderness to palpation, which apparently is chronic for him.  Past Medical History:  Diagnosis Date  . Anemia    Per facility   . Chronic renal disease    Per facility   . Coronary artery disease   . Degenerative disc disease   . Degenerative disc disease   . Diabetes mellitus   . GERD (gastroesophageal reflux disease)   . H/O degenerative disc disease   . Hypercholesterolemia   . Hypertension   . Major depressive disorder   . Osteoarthritis    Per facility   . Psychosis (HCC)   . Vitamin D deficiency    Per facility     Patient Active Problem List   Diagnosis Date Noted  . HTN (hypertension) 09/10/2014  . Diabetes mellitus (HCC) 09/10/2014  . Chronic cholecystitis with calculus 09/09/2014  . 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 Symptomatic cholelithiasis 09/21/2012    Past Surgical History:  Procedure Laterality Date  . CHOLECYSTECTOMY N/A 09/09/2014   Procedure: LAPAROSCOPIC CHOLECYSTECTOMY WITH INTRAOPERATIVE CHOLANGIOGRAM;  Surgeon: Atilano Ina, MD;  Location: [facility];  Service: General;  Laterality: N/A;  . cyst lower aboomen left     done in [place]  . EYE  SURGERY  2003        Home Medications    Prior to Admission medications   Medication Sig Start Date End Date Taking? Authorizing Provider  acetaminophen (TYLENOL) 500 MG tablet Take 500 mg by mouth 2 (two) times daily as needed (for pain).     [provider]  aspirin EC 81 MG tablet Take 81 mg by mouth daily.    [provider]  benztropine (COGENTIN) 1 MG tablet Take 0.5 mg by mouth 2 (two) times daily.     [provider]  clonazePAM (KLONOPIN) 0.5 MG tablet Take 0.5 mg by mouth 2 (two) times daily. scheduled    [provider]  clonazePAM (KLONOPIN) 1 MG tablet Take 1 mg by mouth every 8 (eight) hours as needed for anxiety.    [provider]  docusate sodium (COLACE) 100 MG capsule Take 100 mg by mouth daily.    [provider]  DULoxetine (CYMBALTA) 30 MG capsule Take 90 mg by mouth daily.     [provider]  l-methylfolate-B6-B12 (METANX) 3-35-2 MG TABS Take 0.5 tablets by mouth daily. Takes 1/2 tablet  (7.5 mg) by mouth daily    [provider]  Lurasidone HCl 60 MG TABS Take 60 mg by mouth 2 (two) times daily.     [provider]  memantine (NAMENDA) 10 MG tablet Take 10 mg by mouth  2 (two) times daily.    [provider]  Multiple Vitamin (MULTIVITAMIN WITH MINERALS) TABS tablet Take 1 tablet by mouth daily.    [provider]  nitroGLYCERIN (NITROSTAT) 0.4 MG SL tablet Place 0.4 mg under the tongue every 5 (five) minutes as needed for chest pain.    [provider]  Omega-3 Fatty Acids (FISH OIL) 1000 MG CAPS Take 1 capsule by mouth.    [provider]  omeprazole (PRILOSEC) 20 MG capsule Take 20 mg by mouth daily.    [provider]  promethazine (PHENERGAN) 25 MG tablet Take 1 tablet (25 mg total) by mouth every 6 (six) hours as needed for nausea or vomiting. 07/26/14   Lurene Shadow, PA-C  Propylene Glycol (SYSTANE BALANCE) 0.6 % SOLN Place 1 drop into  both eyes daily.    [provider]  simvastatin (ZOCOR) 20 MG tablet Take 20 mg by mouth at bedtime.    [provider]  trihexyphenidyl (ARTANE) 5 MG tablet Take 5 mg by mouth at bedtime.    [provider]  zolpidem (AMBIEN) 5 MG tablet Take 5 mg by mouth at bedtime.     [provider]    Family History Family History  Problem Relation Age of Onset  . Colon cancer Father   . Ovarian cancer Sister     Social History Social History   Tobacco Use  . Smoking status: Former Smoker    Last attempt to quit: 09/21/1992    Years since quitting: 25.5  . Smokeless tobacco: Never Used  Substance Use Topics  . Alcohol use: No  . Drug use: No     Allergies   Patient has no known allergies.   Review of Systems Review of Systems  All other systems reviewed and are negative.    Physical Exam Updated Vital Signs BP 99/66 (BP Location: Left Arm)   Pulse 65   Temp 97.8 F (36.6 C) (Oral)   Resp 18   SpO2 97%   Physical Exam  Nursing note and vitals reviewed.  62 year old male, resting comfortably and in no acute distress. Vital signs are normal. Oxygen saturation is 97%, which is normal. Head is normocephalic and atraumatic. PERRLA, EOMI. Oropharynx is clear. Neck is nontender and supple without adenopathy or JVD. Back is nontender and there is no CVA tenderness. Lungs are clear without rales, wheezes, or rhonchi. Chest is mildly tender in the bilateral parasternal area. Heart has regular rate and rhythm without murmur. Abdomen is soft, flat, with mild to moderate left lower quadrant tenderness.  There is mild right lower quadrant and suprapubic tenderness.  There is no rebound or guarding.  There are no masses or hepatosplenomegaly and peristalsis is hypoactive. Extremities have no cyanosis or edema, full range of motion is present. Skin is warm and dry without rash. Neurologic: Mental status is normal, cranial nerves are intact, there  are no motor or sensory deficits.  Moderate resting tremor present.  Flat affect.  Masklike facies.  ED Treatments / Results  Labs (all labs ordered are listed, but only abnormal results are displayed) Labs Reviewed  BASIC METABOLIC PANEL - Abnormal; Notable for the following components:      Result Value   Glucose, Bld 115 (*)    All other components within normal limits  HEPATIC FUNCTION PANEL - Abnormal; Notable for the following components:   Total Protein 6.2 (*)    ALT 11 (*)    All other  components within normal limits  URINALYSIS, ROUTINE W REFLEX MICROSCOPIC - Abnormal; Notable for the following components:   Color, Urine STRAW (*)    Specific Gravity, Urine 1.004 (*)    All other components within normal limits  CBC WITH DIFFERENTIAL/PLATELET  LIPASE, BLOOD  I-STAT TROPONIN, ED  I-STAT TROPONIN, ED    EKG EKG Interpretation  Date/Time:  Monday Apr 17 2018 00:05:04 EDT Ventricular Rate:  64 PR Interval:    QRS Duration: 99 QT Interval:  429 QTC Calculation: 443 R Axis:   -55 Text Interpretation:  Sinus rhythm LAD, consider left anterior fascicular block Low voltage, precordial leads When compared with ECG of 07/19/2017, No significant change was found Confirmed by Dione Booze ([id number]) on 04/17/2018 12:21:22 AM   Radiology Dg Chest 2 View  Result Date: 04/17/2018 CLINICAL DATA:  Left arm numbness starting at 2200 hours tonight. Chest pain, worse with palpation. EXAM: CHEST - 2 VIEW COMPARISON:  07/19/2017 FINDINGS: Slight linear fibrosis in the left lung base. Normal heart size and pulmonary vascularity. No focal airspace disease or consolidation in the lungs. No blunting of costophrenic angles. No pneumothorax. Mediastinal contours appear intact. Degenerative changes in the spine and shoulders. IMPRESSION: No active cardiopulmonary disease. Electronically Signed   By: Burman Nieves M.D.   On: 04/17/2018 01:31   Ct Abdomen Pelvis W Contrast  Result Date:  04/17/2018 CLINICAL DATA:  Acute onset of lower abdominal pain. EXAM: CT ABDOMEN AND PELVIS WITH CONTRAST TECHNIQUE: Multidetector CT imaging of the abdomen and pelvis was performed using the standard protocol following bolus administration of intravenous contrast. CONTRAST:  OMNIPAQUE IOHEXOL 300 MG/ML  SOLN COMPARISON:  CT of the abdomen and pelvis performed 06/19/2012, and abdominal ultrasound performed 07/04/2012 FINDINGS: Lower chest: Mild bibasilar atelectasis or scarring is noted. Scattered coronary artery calcifications are seen. Hepatobiliary: The liver is unremarkable in appearance. The patient is status post cholecystectomy, with clips noted at the gallbladder fossa. The common bile duct remains normal in caliber. Pancreas: The pancreas is within normal limits. Spleen: The spleen is unremarkable in appearance. Adrenals/Urinary Tract: The adrenal glands are unremarkable in appearance, aside from minimal calcification at the left adrenal gland. Moderate bilateral renal atrophy is noted. Nonspecific perinephric stranding is noted bilaterally. There is no evidence of hydronephrosis. No renal or ureteral stones are identified. Stomach/Bowel: The stomach is unremarkable in appearance. The small bowel is within normal limits. The appendix is normal in caliber, without evidence of appendicitis. The colon is unremarkable in appearance. Vascular/Lymphatic: The abdominal aorta is unremarkable in appearance. The inferior vena cava is grossly unremarkable. No retroperitoneal lymphadenopathy is seen. No pelvic sidewall lymphadenopathy is identified. Reproductive: The bladder is mildly distended and grossly unremarkable. The prostate is mildly enlarged, measuring 5.1 cm in transverse dimension. Other: A small umbilical hernia is noted, containing only fat. Musculoskeletal: No acute osseous abnormalities are identified. The visualized musculature is unremarkable in appearance. IMPRESSION: 1. No acute abnormality  seen to explain the patient's symptoms. 2. Moderate bilateral renal atrophy noted. 3. Mild bibasilar atelectasis or scarring noted. 4. Scattered coronary artery calcifications seen. 5. Mildly enlarged prostate. 6. Small umbilical hernia, containing only fat. Electronically Signed   By: Roanna Raider M.D.   On: 04/17/2018 03:58    Procedures Procedures   Medications Ordered in ED Medications  morphine 4 MG/ML injection 4 mg (4 mg Intravenous Given 04/17/18 0309)  iohexol (OMNIPAQUE) 300 MG/ML solution 100 mL (100 mLs Intravenous Contrast Given 04/17/18 0325)  ondansetron (ZOFRAN) injection 4  mg (4 mg Intravenous Given 04/17/18 0442)     Initial Impression / Assessment and Plan / ED Course  I have reviewed the triage vital signs and the nursing notes.  Pertinent labs & imaging results that were available during my care of the patient were reviewed by me and considered in my medical decision making (see chart for details).  Chest pain of uncertain cause.  Abdominal pain of uncertain cause.  Consider functional pain, diverticulitis, urinary tract infection, urolithiasis.  Old records are reviewed, and he has no relevant past visits, multiple hospitalizations for psychiatric illness.  Because of inability to trust exam or history, he will be sent for CT of abdomen and pelvis.  ECG shows no acute changes.  Will check troponin as well as delta troponin.  CT scan is unremarkable.  Repeat troponin is normal.  No evidence of significant pathology.  He is discharged back to his nursing care facility with instructions to follow-up with PCP.  He had complained of nausea in the ED and is discharged with prescription for tramadol as well as ondansetron.  Return precautions discussed.  Final Clinical Impressions(s) / ED Diagnoses   Final diagnoses:  Atypical chest pain  Lower abdominal pain    ED Discharge Orders        Ordered    traMADol (ULTRAM) 50 MG tablet  Every 6 hours PRN     04/17/18 0447     ondansetron (ZOFRAN) 4 MG tablet  Every 6 hours PRN     04/17/18 0447      Dione Booze, MD 04/17/18 [id number]

## 2018-04-17 NOTE — Discharge Instructions (Signed)
Return if symptoms are getting worse. °

## 2018-07-11 ENCOUNTER — Encounter (HOSPITAL_COMMUNITY): Payer: Self-pay

## 2018-07-11 ENCOUNTER — Emergency Department (HOSPITAL_COMMUNITY): Payer: Medicaid Other

## 2018-07-11 ENCOUNTER — Emergency Department (HOSPITAL_COMMUNITY)
Admission: EM | Admit: 2018-07-11 | Discharge: 2018-07-11 | Disposition: A | Discharge status: Home / Self Care | Payer: Medicaid Other | Attending: Emergency Medicine | Admitting: Emergency Medicine

## 2018-07-11 DIAGNOSIS — E119 Type 2 diabetes mellitus without complications: Secondary | ICD-10-CM | POA: Diagnosis not present

## 2018-07-11 DIAGNOSIS — K5900 Constipation, unspecified: Secondary | ICD-10-CM | POA: Diagnosis not present

## 2018-07-11 DIAGNOSIS — M5442 Lumbago with sciatica, left side: Secondary | ICD-10-CM

## 2018-07-11 DIAGNOSIS — E7800 Pure hypercholesterolemia, unspecified: Secondary | ICD-10-CM | POA: Insufficient documentation

## 2018-07-11 DIAGNOSIS — M545 Low back pain: Secondary | ICD-10-CM | POA: Insufficient documentation

## 2018-07-11 DIAGNOSIS — I10 Essential (primary) hypertension: Secondary | ICD-10-CM | POA: Insufficient documentation

## 2018-07-11 DIAGNOSIS — Z7982 Long term (current) use of aspirin: Secondary | ICD-10-CM | POA: Diagnosis not present

## 2018-07-11 DIAGNOSIS — G8929 Other chronic pain: Secondary | ICD-10-CM

## 2018-07-11 DIAGNOSIS — Z79899 Other long term (current) drug therapy: Secondary | ICD-10-CM | POA: Insufficient documentation

## 2018-07-11 DIAGNOSIS — Z87891 Personal history of nicotine dependence: Secondary | ICD-10-CM | POA: Diagnosis not present

## 2018-07-11 DIAGNOSIS — I2510 Atherosclerotic heart disease of native coronary artery without angina pectoris: Secondary | ICD-10-CM | POA: Insufficient documentation

## 2018-07-11 MED ORDER — KETOROLAC TROMETHAMINE 60 MG/2ML IM SOLN
60.0000 mg | Freq: Once | INTRAMUSCULAR | Status: AC
  Administered 2018-07-11: 60 mg via INTRAMUSCULAR
  Filled 2018-07-11: qty 2

## 2018-07-11 MED ORDER — METHOCARBAMOL 500 MG PO TABS: 500.0000 mg | ORAL_TABLET | Freq: Three times a day (TID) | ORAL | 0 refills | Status: DC | PRN

## 2018-07-11 MED ORDER — POLYETHYLENE GLYCOL 3350 17 G PO PACK: 17.0000 g | PACK | Freq: Every day | ORAL | 0 refills | Status: DC | PRN

## 2018-07-11 MED ORDER — METHOCARBAMOL 500 MG PO TABS
1000.0000 mg | ORAL_TABLET | Freq: Once | ORAL | Status: AC
  Administered 2018-07-11: 1000 mg via ORAL
  Filled 2018-07-11: qty 2

## 2018-07-11 NOTE — ED Triage Notes (Signed)
[facility] for lower back pain. This is a chronic issue but pt states it has gotten worse over the last week after he twisted his back getting off a bus. Patient A&O x4. Patient given tramadol tonight 50mg .  Vitals BP 126/72 HR 98 SpO2 95%

## 2018-07-11 NOTE — ED Provider Notes (Signed)
[facility]-EMERGENCY DEPT Provider Note   CSN: [id number] Arrival date & time: 07/11/18  1958     History   Chief Complaint No chief complaint on file.   HPI [name] is a 62 y.o. male.  HPI Patient with history of chronic back pain presents with acute exacerbation over the last week.  States he was getting onto a bus and had to twist.  This caused back to hurt.  States the pain radiates down his left leg.  He had no weakness or numbness but does complain of some tingling to the left foot.  No urinary retention or incontinence. Past Medical History:  Diagnosis Date  . Anemia    Per facility   . Chronic renal disease    Per facility   . Coronary artery disease   . Degenerative disc disease   . Degenerative disc disease   . Diabetes mellitus   . GERD (gastroesophageal reflux disease)   . H/O degenerative disc disease   . Hypercholesterolemia   . Hypertension   . Major depressive disorder   . Osteoarthritis    Per facility   . Psychosis (HCC)   . Vitamin D deficiency    Per facility     Patient Active Problem List   Diagnosis Date Noted  . HTN (hypertension) 09/10/2014  . Diabetes mellitus (HCC) 09/10/2014  . Chronic cholecystitis with calculus 09/09/2014  . 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 Symptomatic cholelithiasis 09/21/2012    Past Surgical History:  Procedure Laterality Date  . CHOLECYSTECTOMY N/A 09/09/2014   Procedure: LAPAROSCOPIC CHOLECYSTECTOMY WITH INTRAOPERATIVE CHOLANGIOGRAM;  Surgeon: Atilano InaEric M Wilson, MD;  Location: [facility];  Service: General;  Laterality: N/A;  . cyst lower aboomen left     done in [place]  . EYE SURGERY  2003        Home Medications    Prior to Admission medications   Medication Sig Start Date End Date Taking? Authorizing Provider  acetaminophen (TYLENOL) 500 MG tablet Take 500 mg by mouth 2 (two) times daily as needed (for pain).    Yes [provider]    ARIPiprazole ER (ABILIFY MAINTENA) 400 MG SRER injection Inject 400 mg into the muscle every 3 (three) months.   Yes [provider]  aspirin EC 81 MG tablet Take 81 mg by mouth daily.   Yes [provider]  benztropine (COGENTIN) 1 MG tablet Take 1 mg by mouth 2 (two) times daily.    Yes [provider]  clonazePAM (KLONOPIN) 0.5 MG tablet Take 0.5 mg by mouth 2 (two) times daily. scheduled   Yes [provider]  docusate sodium (COLACE) 100 MG capsule Take 100 mg by mouth daily.   Yes [provider]  DULoxetine (CYMBALTA) 30 MG capsule Take 90 mg by mouth daily.    Yes [provider]  memantine (NAMENDA) 10 MG tablet Take 10 mg by mouth 2 (two) times daily.   Yes [provider]  naproxen (NAPROSYN) 250 MG tablet Take 250 mg by mouth 2 (two) times daily with a meal.   Yes [provider]  nitroGLYCERIN (NITROSTAT) 0.4 MG SL tablet Place 0.4 mg under the tongue every 5 (five) minutes as needed for chest pain.   Yes [provider]  OLANZapine (ZYPREXA) 7.5 MG tablet Take 7.5 mg by mouth at bedtime.   Yes [provider]  Omega-3 Fatty Acids (FISH OIL) 1000 MG CAPS Take 1 capsule by mouth.  Yes [provider]  Propylene Glycol (SYSTANE BALANCE) 0.6 % SOLN Place 1 drop into both eyes daily.   Yes [provider]  simvastatin (ZOCOR) 20 MG tablet Take 20 mg by mouth at bedtime.   Yes [provider]  methocarbamol (ROBAXIN) 500 MG tablet Take 1 tablet (500 mg total) by mouth every 8 (eight) hours as needed for muscle spasms. 07/11/18   Loren Racer, MD  polyethylene glycol [facility] Ethelene Hal) packet Take 17 g by mouth daily as needed for moderate constipation. 07/11/18   Loren Racer, MD  traMADol (ULTRAM) 50 MG tablet Take 1 tablet (50 mg total) by mouth every 6 (six) hours as needed. Patient not taking: Reported on 07/11/2018 04/17/18   Dione Booze, MD    Family  History Family History  Problem Relation Age of Onset  . Colon cancer Father   . Ovarian cancer Sister     Social History Social History   Tobacco Use  . Smoking status: Former Smoker    Last attempt to quit: 09/21/1992    Years since quitting: 25.8  . Smokeless tobacco: Never Used  Substance Use Topics  . Alcohol use: No  . Drug use: No     Allergies   Patient has no known allergies.   Review of Systems Review of Systems  Constitutional: Negative for chills and fever.  Eyes: Negative for visual disturbance.  Respiratory: Negative for cough and shortness of breath.   Cardiovascular: Negative for chest pain, palpitations and leg swelling.  Gastrointestinal: Negative for abdominal pain, constipation, diarrhea, nausea and vomiting.  Genitourinary: Negative for difficulty urinating, dysuria, flank pain and frequency.  Musculoskeletal: Positive for back pain and myalgias. Negative for neck pain and neck stiffness.  Skin: Negative for rash and wound.  Neurological: Negative for dizziness, weakness, light-headedness, numbness and headaches.  All other systems reviewed and are negative.    Physical Exam Updated Vital Signs BP 101/65 (BP Location: Right Arm)   Pulse 95   Temp 98 F (36.7 C) (Oral)   Resp 18   SpO2 97%   Physical Exam  Constitutional: He is oriented to person, place, and time. He appears well-developed and well-nourished. No distress.  HENT:  Head: Normocephalic and atraumatic.  Mouth/Throat: Oropharynx is clear and moist.  Eyes: Pupils are equal, round, and reactive to light. EOM are normal.  Neck: Normal range of motion. Neck supple.  Cardiovascular: Normal rate and regular rhythm.  Pulmonary/Chest: Effort normal and breath sounds normal.  Abdominal: Soft. Bowel sounds are normal. There is no tenderness. There is no rebound and no guarding.  Musculoskeletal: Normal range of motion. He exhibits tenderness. He exhibits no edema.  Patient has superior  lumbar midline tenderness to palpation.  He has left-sided paraspinal tenderness.  Negative straight leg raise bilaterally.  Distal pulses are 2+.  No lower extremity swelling, asymmetry or tenderness.  Neurological: He is alert and oriented to person, place, and time.  5/5 motor in all extremities.  Sensation to light touch intact including no saddle anesthesia.  Skin: Skin is warm and dry. Capillary refill takes less than 2 seconds. No rash noted. He is not diaphoretic. No erythema.  Psychiatric: He has a normal mood and affect. His behavior is normal.  Nursing note and vitals reviewed.    ED Treatments / Results  Labs (all labs ordered are listed, but only abnormal results are displayed) Labs Reviewed - No data to display  EKG None  Radiology Dg Lumbar Spine 2-3 Views  Result Date: 07/11/2018 CLINICAL DATA:  Chronic low back pain which has worsened over the past week. The patient reports a twisting injury getting off a bus today. Initial encounter. EXAM: LUMBAR SPINE - 2-3 VIEW COMPARISON:  CT abdomen and pelvis 04/17/2018. FINDINGS: There is no evidence of lumbar spine fracture. Alignment is normal. Intervertebral disc spaces are maintained. AP view demonstrates a very large colonic stool burden. IMPRESSION: Normal appearing lumbar spine. Partial visualization of a large colonic stool burden. Electronically Signed   By: Drusilla Kannerhomas  Dalessio M.D.   On: 07/11/2018 20:58    Procedures Procedures (including critical care time)  Medications Ordered in ED Medications  ketorolac (TORADOL) injection 60 mg (60 mg Intramuscular Given 07/11/18 2055)  methocarbamol (ROBAXIN) tablet 1,000 mg (1,000 mg Oral Given 07/11/18 2055)     Initial Impression / Assessment and Plan / ED Course  I have reviewed the triage vital signs and the nursing notes.  Pertinent labs & imaging results that were available during my care of the patient were reviewed by me and considered in my medical decision making (see  chart for details).    Normal neurologic exam.  Lumbar x-ray without acute findings.  Does appear to have likely constipation.  Patient had recent CT abdomen pelvis without acute findings.  Will treat symptomatically.  [facility]  Advised to follow-up with neurosurgeon as outpatient for ongoing symptoms.   Final Clinical Impressions(s) / ED Diagnoses   Final diagnoses:  Chronic midline low back pain with left-sided sciatica  Constipation, unspecified constipation type    ED Discharge Orders         Ordered    methocarbamol (ROBAXIN) 500 MG tablet  Every 8 hours PRN     07/11/18 2150    polyethylene glycol (MIRALAX / GLYCOLAX) packet  Daily PRN     07/11/18 2150           Loren RacerYelverton, Shunta Mclaurin, MD 07/11/18 2152

## 2018-07-11 NOTE — ED Notes (Signed)
[facility] called for transport.  

## 2018-07-11 NOTE — ED Notes (Signed)
Patient transported to X-ray 

## 2018-08-23 ENCOUNTER — Other Ambulatory Visit: Payer: Self-pay | Admitting: Neurosurgery

## 2018-08-23 DIAGNOSIS — M545 Low back pain, unspecified: Secondary | ICD-10-CM

## 2018-09-19 ENCOUNTER — Ambulatory Visit
Admission: RE | Admit: 2018-09-19 | Discharge: 2018-09-19 | Disposition: A | Discharge status: Home / Self Care | Payer: Medicaid Other | Source: Ambulatory Visit | Attending: Neurosurgery | Admitting: Neurosurgery

## 2018-09-19 DIAGNOSIS — M545 Low back pain, unspecified: Secondary | ICD-10-CM

## 2018-11-01 ENCOUNTER — Other Ambulatory Visit: Payer: Self-pay

## 2018-11-01 ENCOUNTER — Emergency Department (HOSPITAL_COMMUNITY): Payer: Medicaid Other

## 2018-11-01 ENCOUNTER — Inpatient Hospital Stay (HOSPITAL_COMMUNITY)
Admission: EM | Admit: 2018-11-01 | Discharge: 2018-11-03 | DRG: 193 | Disposition: A | Discharge status: Nursing Home (Includes ICF) | Payer: Medicaid Other | Attending: Internal Medicine | Admitting: Internal Medicine

## 2018-11-01 ENCOUNTER — Encounter (HOSPITAL_COMMUNITY): Payer: Self-pay | Admitting: Emergency Medicine

## 2018-11-01 DIAGNOSIS — I129 Hypertensive chronic kidney disease with stage 1 through stage 4 chronic kidney disease, or unspecified chronic kidney disease: Secondary | ICD-10-CM | POA: Diagnosis present

## 2018-11-01 DIAGNOSIS — Z7982 Long term (current) use of aspirin: Secondary | ICD-10-CM

## 2018-11-01 DIAGNOSIS — E119 Type 2 diabetes mellitus without complications: Secondary | ICD-10-CM

## 2018-11-01 DIAGNOSIS — F329 Major depressive disorder, single episode, unspecified: Secondary | ICD-10-CM | POA: Diagnosis present

## 2018-11-01 DIAGNOSIS — J9601 Acute respiratory failure with hypoxia: Secondary | ICD-10-CM | POA: Diagnosis present

## 2018-11-01 DIAGNOSIS — I10 Essential (primary) hypertension: Secondary | ICD-10-CM | POA: Diagnosis present

## 2018-11-01 DIAGNOSIS — Z79899 Other long term (current) drug therapy: Secondary | ICD-10-CM

## 2018-11-01 DIAGNOSIS — Z66 Do not resuscitate: Secondary | ICD-10-CM | POA: Diagnosis present

## 2018-11-01 DIAGNOSIS — K219 Gastro-esophageal reflux disease without esophagitis: Secondary | ICD-10-CM | POA: Diagnosis present

## 2018-11-01 DIAGNOSIS — Y95 Nosocomial condition: Secondary | ICD-10-CM | POA: Diagnosis present

## 2018-11-01 DIAGNOSIS — I2510 Atherosclerotic heart disease of native coronary artery without angina pectoris: Secondary | ICD-10-CM | POA: Diagnosis present

## 2018-11-01 DIAGNOSIS — Z87891 Personal history of nicotine dependence: Secondary | ICD-10-CM

## 2018-11-01 DIAGNOSIS — N189 Chronic kidney disease, unspecified: Secondary | ICD-10-CM | POA: Diagnosis present

## 2018-11-01 DIAGNOSIS — E1122 Type 2 diabetes mellitus with diabetic chronic kidney disease: Secondary | ICD-10-CM | POA: Diagnosis present

## 2018-11-01 DIAGNOSIS — J189 Pneumonia, unspecified organism: Secondary | ICD-10-CM | POA: Diagnosis present

## 2018-11-01 DIAGNOSIS — J181 Lobar pneumonia, unspecified organism: Principal | ICD-10-CM | POA: Diagnosis present

## 2018-11-01 DIAGNOSIS — E7800 Pure hypercholesterolemia, unspecified: Secondary | ICD-10-CM | POA: Diagnosis present

## 2018-11-01 DIAGNOSIS — Z6841 Body Mass Index (BMI) 40.0 and over, adult: Secondary | ICD-10-CM

## 2018-11-01 LAB — BASIC METABOLIC PANEL
Anion gap: 10 (ref 5–15)
BUN: 15 mg/dL (ref 8–23)
CO2: 26 mmol/L (ref 22–32)
Calcium: 9.7 mg/dL (ref 8.9–10.3)
Chloride: 102 mmol/L (ref 98–111)
Creatinine, Ser: 0.98 mg/dL (ref 0.61–1.24)
GFR calc Af Amer: >60 mL/min (ref >60)
GFR calc non Af Amer: >60 mL/min (ref >60)
Glucose, Bld: 133 mg/dL — ABNORMAL HIGH (ref 70–99)
Potassium: 4.1 mmol/L (ref 3.5–5.1)
Sodium: 138 mmol/L (ref 135–145)

## 2018-11-01 LAB — CBC WITH DIFFERENTIAL/PLATELET
Abs Immature Granulocytes: 0.02 10*3/uL (ref 0.00–0.07)
Basophils Absolute: 0 10*3/uL (ref 0.0–0.1)
Basophils Relative: 0 %
Eosinophils Absolute: 0.1 10*3/uL (ref 0.0–0.5)
Eosinophils Relative: 2 %
HCT: 44.3 % (ref 39.0–52.0)
Hemoglobin: 13.6 g/dL (ref 13.0–17.0)
Immature Granulocytes: 0 %
Lymphocytes Relative: 22 %
Lymphs Abs: 1.6 10*3/uL (ref 0.7–4.0)
MCH: 28.5 pg (ref 26.0–34.0)
MCHC: 30.7 g/dL (ref 30.0–36.0)
MCV: 92.9 fL (ref 80.0–100.0)
Monocytes Absolute: 0.5 10*3/uL (ref 0.1–1.0)
Monocytes Relative: 6 %
Neutro Abs: 5.2 10*3/uL (ref 1.7–7.7)
Neutrophils Relative %: 70 %
Platelets: 218 10*3/uL (ref 150–400)
RBC: 4.77 MIL/uL (ref 4.22–5.81)
RDW: 13.8 % (ref 11.5–15.5)
WBC: 7.5 10*3/uL (ref 4.0–10.5)
nRBC: 0 % (ref 0.0–0.2)

## 2018-11-01 LAB — BRAIN NATRIURETIC PEPTIDE: B Natriuretic Peptide: 18.9 pg/mL (ref 0.0–100.0)

## 2018-11-01 MED ORDER — SODIUM CHLORIDE 0.9 % IV SOLN
1.0000 g | Freq: Once | INTRAVENOUS | Status: DC
  Filled 2018-11-01: qty 1

## 2018-11-01 MED ORDER — ENOXAPARIN SODIUM 40 MG/0.4ML ~~LOC~~ SOLN
40.0000 mg | Freq: Every day | SUBCUTANEOUS | Status: DC
  Administered 2018-11-02 – 2018-11-03 (×2): 40 mg via SUBCUTANEOUS
  Filled 2018-11-01 (×3): qty 0.4

## 2018-11-01 MED ORDER — SODIUM CHLORIDE 0.9 % IV SOLN
1.0000 g | Freq: Three times a day (TID) | INTRAVENOUS | Status: DC
  Administered 2018-11-02 – 2018-11-03 (×5): 1 g via INTRAVENOUS
  Filled 2018-11-01 (×8): qty 1

## 2018-11-01 MED ORDER — VANCOMYCIN HCL IN DEXTROSE 1-5 GM/200ML-% IV SOLN
1000.0000 mg | Freq: Two times a day (BID) | INTRAVENOUS | Status: DC
  Administered 2018-11-02 – 2018-11-03 (×3): 1000 mg via INTRAVENOUS
  Filled 2018-11-01 (×4): qty 200

## 2018-11-01 MED ORDER — VANCOMYCIN HCL 10 G IV SOLR
2000.0000 mg | Freq: Once | INTRAVENOUS | Status: AC
  Administered 2018-11-02: 2000 mg via INTRAVENOUS
  Filled 2018-11-01: qty 2000

## 2018-11-01 MED ORDER — ALBUTEROL (5 MG/ML) CONTINUOUS INHALATION SOLN
10.0000 mg/h | INHALATION_SOLUTION | RESPIRATORY_TRACT | Status: AC
  Administered 2018-11-01: 10 mg/h via RESPIRATORY_TRACT
  Filled 2018-11-01: qty 20

## 2018-11-01 MED ORDER — SODIUM CHLORIDE 0.9 % IV SOLN
2.0000 g | Freq: Once | INTRAVENOUS | Status: AC
  Administered 2018-11-02: 2 g via INTRAVENOUS
  Filled 2018-11-01: qty 2

## 2018-11-01 NOTE — ED Provider Notes (Signed)
[facility] EMERGENCY DEPARTMENT Provider Note   CSN: [id number] Arrival date & time: 11/01/18  2035     History   Chief Complaint Chief Complaint  Patient presents with  . Cough    HPI Marcques Wrightsman is a 62 y.o. male.  HPI Patient presents to the emergency department with cough over the last month that is been worsening over the last week.  The patient states that he is coughing episodes causing him to have choking and causing him to break out in a sweat.  Patient states that the last 3 days have been the worst and it is worse at night.  Patient was given cough medicines but did not seem to help.  The patient states that nothing seems make the condition better or worse.  The patient denies chest pain,  headache,blurred vision, neck pain, fever,weakness, numbness, dizziness, anorexia, edema, abdominal pain, nausea, vomiting, diarrhea, rash, back pain, dysuria, hematemesis, bloody stool, near syncope, or syncope. Past Medical History:  Diagnosis Date  . Anemia    Per facility   . Chronic renal disease    Per facility   . Coronary artery disease   . Degenerative disc disease   . Degenerative disc disease   . Diabetes mellitus   . GERD (gastroesophageal reflux disease)   . H/O degenerative disc disease   . Hypercholesterolemia   . Hypertension   . Major depressive disorder   . Osteoarthritis    Per facility   . Psychosis (HCC)   . Vitamin D deficiency    Per facility     Patient Active Problem List   Diagnosis Date Noted  . HTN (hypertension) 09/10/2014  . Diabetes mellitus (HCC) 09/10/2014  . Chronic cholecystitis with calculus 09/09/2014  . 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 Symptomatic cholelithiasis 09/21/2012    Past Surgical History:  Procedure Laterality Date  . CHOLECYSTECTOMY N/A 09/09/2014   Procedure: LAPAROSCOPIC CHOLECYSTECTOMY WITH INTRAOPERATIVE CHOLANGIOGRAM;  Surgeon: Atilano Ina, MD;   Location: [facility];  Service: General;  Laterality: N/A;  . cyst lower aboomen left     done in [place]  . EYE SURGERY  2003        Home Medications    Prior to Admission medications   Medication Sig Start Date End Date Taking? Authorizing Provider  acetaminophen (TYLENOL) 500 MG tablet Take 500 mg by mouth 2 (two) times daily as needed (for pain).     [provider]  ARIPiprazole ER (ABILIFY MAINTENA) 400 MG SRER injection Inject 400 mg into the muscle every 3 (three) months.    [provider]  aspirin EC 81 MG tablet Take 81 mg by mouth daily.    [provider]  benztropine (COGENTIN) 1 MG tablet Take 1 mg by mouth 2 (two) times daily.     [provider]  clonazePAM (KLONOPIN) 0.5 MG tablet Take 0.5 mg by mouth 2 (two) times daily. scheduled    [provider]  docusate sodium (COLACE) 100 MG capsule Take 100 mg by mouth daily.    [provider]  DULoxetine (CYMBALTA) 30 MG capsule Take 90 mg by mouth daily.     [provider]  memantine (NAMENDA) 10 MG tablet Take 10 mg by mouth 2 (two) times daily.    [provider]  methocarbamol (ROBAXIN) 500 MG tablet Take 1 tablet (500 mg total) by mouth every 8 (eight) hours as needed for muscle spasms. 07/11/18   Ranae Palms,  Onalee Hua, MD  naproxen (NAPROSYN) 250 MG tablet Take 250 mg by mouth 2 (two) times daily with a meal.    [provider]  nitroGLYCERIN (NITROSTAT) 0.4 MG SL tablet Place 0.4 mg under the tongue every 5 (five) minutes as needed for chest pain.    [provider]  OLANZapine (ZYPREXA) 7.5 MG tablet Take 7.5 mg by mouth at bedtime.    [provider]  Omega-3 Fatty Acids (FISH OIL) 1000 MG CAPS Take 1 capsule by mouth.    [provider]  polyethylene glycol (MIRALAX / GLYCOLAX) packet Take 17 g by mouth daily as needed for moderate constipation. 07/11/18   Loren Racer, MD  Propylene Glycol (SYSTANE BALANCE) 0.6 % SOLN  Place 1 drop into both eyes daily.    [provider]  simvastatin (ZOCOR) 20 MG tablet Take 20 mg by mouth at bedtime.    [provider]  traMADol (ULTRAM) 50 MG tablet Take 1 tablet (50 mg total) by mouth every 6 (six) hours as needed. Patient not taking: Reported on 07/11/2018 04/17/18   Dione Booze, MD    Family History Family History  Problem Relation Age of Onset  . Colon cancer Father   . Ovarian cancer Sister     Social History Social History   Tobacco Use  . Smoking status: Former Smoker    Last attempt to quit: 09/21/1992    Years since quitting: 26.1  . Smokeless tobacco: Never Used  Substance Use Topics  . Alcohol use: No  . Drug use: No     Allergies   Patient has no known allergies.   Review of Systems Review of Systems All other systems negative except as documented in the HPI. All pertinent positives and negatives as reviewed in the HPI.  Physical Exam Updated Vital Signs BP 107/71   Pulse (!) 109   Resp (!) 30   Ht 5\' 8"  (1.727 m)   Wt 123.4 kg   SpO2 99%   BMI 41.36 kg/m   Physical Exam  Constitutional: He is oriented to person, place, and time. He appears well-developed and well-nourished. No distress.  HENT:  Head: Normocephalic and atraumatic.  Mouth/Throat: Oropharynx is clear and moist.  Eyes: Pupils are equal, round, and reactive to light.  Neck: Normal range of motion. Neck supple.  Cardiovascular: Normal rate, regular rhythm and normal heart sounds. Exam reveals no gallop and no friction rub.  No murmur heard. Pulmonary/Chest: Effort normal. Tachypnea noted. No respiratory distress. He has wheezes. He has no rhonchi. He has no rales.  Abdominal: Soft. Bowel sounds are normal. He exhibits no distension. There is no tenderness.  Neurological: He is alert and oriented to person, place, and time. He exhibits normal muscle tone. Coordination normal.  Skin: Skin is warm and dry. Capillary refill takes less than 2 seconds.  No rash noted. No erythema.  Psychiatric: He has a normal mood and affect. His behavior is normal.  Nursing note and vitals reviewed.    ED Treatments / Results  Labs (all labs ordered are listed, but only abnormal results are displayed) Labs Reviewed  BASIC METABOLIC PANEL - Abnormal; Notable for the following components:      Result Value   Glucose, Bld 133 (*)    All other components within normal limits  CBC WITH DIFFERENTIAL/PLATELET  BRAIN NATRIURETIC PEPTIDE  URINALYSIS, ROUTINE W REFLEX MICROSCOPIC    EKG EKG Interpretation  Date/Time:  Wednesday November 01 2018 20:46:09 EST Ventricular Rate:  99 PR Interval:    QRS Duration: 85 QT Interval:  328 QTC Calculation: 421 R Axis:   -97 Text Interpretation:  Sinus rhythm Left anterior fascicular block Low voltage, precordial leads Abnormal R-wave progression, late transition No significant change since last tracing Confirmed by Alvira MondaySchlossman, Erin ([id number]) on 11/01/2018 11:25:17 PM   Radiology Dg Chest 2 View  Result Date: 11/01/2018 CLINICAL DATA:  62 year old male with cough. EXAM: CHEST - 2 VIEW COMPARISON:  Chest radiograph dated 04/17/2018 FINDINGS: There is shallow inspiration. Left lung base hazy airspace densities may represent atelectasis or infiltrate. Clinical correlation is recommended. There is no pleural effusion or pneumothorax. Stable mild cardiomegaly. No acute osseous pathology. IMPRESSION: Left lung base atelectasis versus infiltrate. Electronically Signed   By: Elgie CollardArash  Radparvar M.D.   On: 11/01/2018 21:42    Procedures Procedures (including critical care time)  Medications Ordered in ED Medications  albuterol (PROVENTIL,VENTOLIN) solution continuous neb (0 mg/hr Nebulization Stopped 11/01/18 2245)  ceFEPIme (MAXIPIME) 2 g in sodium chloride 0.9 % 100 mL IVPB (has no administration in time range)     Initial Impression / Assessment and Plan / ED Course  I have reviewed the triage vital signs and the  nursing notes.  Pertinent labs & imaging results that were available during my care of the patient were reviewed by me and considered in my medical decision making (see chart for details).     Patient will need admission to the hospital for further care and management of his respiratory issue and pneumonia.  Patient when I reassessed him still breathing greater than 20 times per minute patient.  His pulse oximetry dropped into the upper 80s and stayed in the lower 90s.  Does not normally wear oxygen. Final Clinical Impressions(s) / ED Diagnoses   Final diagnoses:  None    ED Discharge Orders    None       Charlestine NightLawyer, Vrishank Moster, PA-C 11/01/18 [id number]    Alvira MondaySchlossman, Erin, MD 11/02/18 1309

## 2018-11-01 NOTE — ED Notes (Signed)
Patient transported to X-ray 

## 2018-11-01 NOTE — ED Triage Notes (Signed)
[facility]/o productive cough and chest pain for the past month. Worse for the past 3 days and worsening at night. Was seen by doctor a few weeks ago and was given medication for cough but does not seem to be getting better.

## 2018-11-01 NOTE — ED Notes (Signed)
Patient is out of room in xray at this time 

## 2018-11-01 NOTE — Progress Notes (Signed)
Pharmacy Antibiotic Note  [name] is a 62 y.o. male admitted on 11/01/2018 with pneumonia.  Pharmacy has been consulted for vancomycin dosing.  Plan: Vancomycin 2gm IV x 1 then 1gm IV q12 hours Initial cefepime dose changed to 2gm F/u renal function, cultures and clinical course  Height: 5\' 8"  (172.7 cm) Weight: 272 lb (123.4 kg) IBW/kg (Calculated) : 68.4  No data recorded.  Recent Labs  Lab 11/01/18 2041  WBC 7.5  CREATININE 0.98    Estimated Creatinine Clearance: 98.7 mL/min (by C-G formula based on SCr of 0.98 mg/dL).    No Known Allergies   Thank you for allowing pharmacy to be a part of this patient's care.  Talbert CageSeay, [name] 11/01/2018 11:31 PM

## 2018-11-02 ENCOUNTER — Observation Stay (HOSPITAL_BASED_OUTPATIENT_CLINIC_OR_DEPARTMENT_OTHER): Payer: Medicaid Other

## 2018-11-02 ENCOUNTER — Other Ambulatory Visit: Payer: Self-pay

## 2018-11-02 DIAGNOSIS — I10 Essential (primary) hypertension: Secondary | ICD-10-CM

## 2018-11-02 DIAGNOSIS — J189 Pneumonia, unspecified organism: Secondary | ICD-10-CM

## 2018-11-02 DIAGNOSIS — R0602 Shortness of breath: Secondary | ICD-10-CM | POA: Diagnosis not present

## 2018-11-02 DIAGNOSIS — J9601 Acute respiratory failure with hypoxia: Secondary | ICD-10-CM | POA: Diagnosis not present

## 2018-11-02 LAB — URINALYSIS, ROUTINE W REFLEX MICROSCOPIC
Bilirubin Urine: NEGATIVE
Glucose, UA: NEGATIVE mg/dL
Hgb urine dipstick: NEGATIVE
Ketones, ur: NEGATIVE mg/dL
Leukocytes, UA: NEGATIVE
Nitrite: NEGATIVE
Protein, ur: NEGATIVE mg/dL
Specific Gravity, Urine: 1.011 (ref 1.005–1.030)
pH: 6 (ref 5.0–8.0)

## 2018-11-02 LAB — INFLUENZA PANEL BY PCR (TYPE A & B)
Influenza A By PCR: NEGATIVE
Influenza B By PCR: NEGATIVE

## 2018-11-02 LAB — EXPECTORATED SPUTUM ASSESSMENT W GRAM STAIN, RFLX TO RESP C

## 2018-11-02 LAB — EXPECTORATED SPUTUM ASSESSMENT W REFEX TO RESP CULTURE

## 2018-11-02 LAB — STREP PNEUMONIAE URINARY ANTIGEN: Strep Pneumo Urinary Antigen: NEGATIVE

## 2018-11-02 LAB — HIV ANTIBODY (ROUTINE TESTING W REFLEX): HIV Screen 4th Generation wRfx: NONREACTIVE

## 2018-11-02 MED ORDER — TRAMADOL HCL 50 MG PO TABS
50.0000 mg | ORAL_TABLET | Freq: Four times a day (QID) | ORAL | Status: DC | PRN
  Filled 2018-11-02: qty 1

## 2018-11-02 MED ORDER — CLONAZEPAM 0.5 MG PO TABS
0.5000 mg | ORAL_TABLET | Freq: Two times a day (BID) | ORAL | Status: DC
  Administered 2018-11-02 – 2018-11-03 (×2): 0.5 mg via ORAL
  Filled 2018-11-02 (×2): qty 1

## 2018-11-02 MED ORDER — BENZTROPINE MESYLATE 1 MG PO TABS
1.0000 mg | ORAL_TABLET | Freq: Two times a day (BID) | ORAL | Status: DC
  Administered 2018-11-02 – 2018-11-03 (×2): 1 mg via ORAL
  Filled 2018-11-02 (×3): qty 1

## 2018-11-02 MED ORDER — OLANZAPINE 5 MG PO TABS
7.5000 mg | ORAL_TABLET | Freq: Every day | ORAL | Status: DC
  Administered 2018-11-02: 7.5 mg via ORAL
  Filled 2018-11-02: qty 1

## 2018-11-02 MED ORDER — DM-GUAIFENESIN ER 30-600 MG PO TB12
1.0000 | ORAL_TABLET | Freq: Two times a day (BID) | ORAL | Status: DC
  Administered 2018-11-02 – 2018-11-03 (×3): 1 via ORAL
  Filled 2018-11-02 (×3): qty 1

## 2018-11-02 MED ORDER — SIMVASTATIN 20 MG PO TABS
20.0000 mg | ORAL_TABLET | Freq: Every day | ORAL | Status: DC
  Administered 2018-11-02: 20 mg via ORAL
  Filled 2018-11-02: qty 1

## 2018-11-02 MED ORDER — MEMANTINE HCL 10 MG PO TABS
10.0000 mg | ORAL_TABLET | Freq: Two times a day (BID) | ORAL | Status: DC
  Administered 2018-11-02 – 2018-11-03 (×2): 10 mg via ORAL
  Filled 2018-11-02 (×3): qty 1

## 2018-11-02 MED ORDER — ORAL CARE MOUTH RINSE
15.0000 mL | Freq: Two times a day (BID) | OROMUCOSAL | Status: DC
  Administered 2018-11-02 (×2): 15 mL via OROMUCOSAL

## 2018-11-02 NOTE — Progress Notes (Addendum)
Same day note.  Patient seen and examined at bedside.  Was admitted this morning.  Admitted for pneumonia with hypoxia.  Patient complains of left leg pain, cough and dyspnea.  On examination, patient is morbidly obese on nasal cannula.  Diminished breath sounds bilaterally.  Mild leg tenderness of the left on palpation without edema or erythema.  Laboratory data reviewed.  Vitals are reasonably stable.  No leukocytosis.  He is currently on cefepime and vancomycin.  He is from assisted living facility.  Streptococcal urinary antigen and influenza panel was negative.  Blood cultures have been pending.  Urinalysis was negative as well.  Follow blood cultures.  We will continue to wean the patient.  Patient normally does not use oxygen at the assisted living facility.  He does walk with the help of a walker.  Will get physical therapy evaluation.  No charge  [name], MD [facility] Pager: [phone]

## 2018-11-02 NOTE — H&P (Signed)
History and Physical    [name] WUJ:[id number] DOB: 1956-05-28 DOA: 11/01/2018  PCP: System, Pcp Not In  Patient coming from: ALF  I have personally briefly reviewed patient's old medical records in [facility]  Chief Complaint: Cough, SOB  HPI: [name] is a 62 y.o. male with medical history significant of major depressive disorder with psychosis, HTN and DM2 apparently diet controlled.  Resides in ALF.  Patient presents to the ED with worsening productive cough, congestion, SOB.  Had some symptoms for past month but significantly worse over the last 3 days.  Cough meds dont seem to help.  No fever, CP, headache, abd pain, N/V/D.   ED Course: CXR suspicious for L basilar PNA.  Satting low 90s and upper 80s on RA, improved with [place].  HR 109, RR upper 20s.  Put on cefepime and vanc, hospitalist asked to admit.   Review of Systems: As per HPI otherwise 10 point review of systems negative.   Past Medical History:  Diagnosis Date  . Anemia    Per facility   . Chronic renal disease    Per facility   . Coronary artery disease   . Degenerative disc disease   . Degenerative disc disease   . Diabetes mellitus   . GERD (gastroesophageal reflux disease)   . H/O degenerative disc disease   . Hypercholesterolemia   . Hypertension   . Major depressive disorder   . Osteoarthritis    Per facility   . Psychosis (HCC)   . Vitamin D deficiency    Per facility     Past Surgical History:  Procedure Laterality Date  . CHOLECYSTECTOMY N/A 09/09/2014   Procedure: LAPAROSCOPIC CHOLECYSTECTOMY WITH INTRAOPERATIVE CHOLANGIOGRAM;  Surgeon: Atilano Ina, MD;  Location: [facility];  Service: General;  Laterality: N/A;  . cyst lower aboomen left     done in [place]  . EYE SURGERY  2003     reports that he quit smoking about 26 years ago. He has never used smokeless tobacco. He reports that he does not drink alcohol or use drugs.  No Known Allergies  Family History  Problem Relation  Age of Onset  . Colon cancer Father   . Ovarian cancer Sister      Prior to Admission medications   Medication Sig Start Date End Date Taking? Authorizing Provider  acetaminophen (TYLENOL) 500 MG tablet Take 500 mg by mouth 2 (two) times daily as needed (for pain).     [provider]  ARIPiprazole ER (ABILIFY MAINTENA) 400 MG SRER injection Inject 400 mg into the muscle every 3 (three) months.    [provider]  aspirin EC 81 MG tablet Take 81 mg by mouth daily.    [provider]  benztropine (COGENTIN) 1 MG tablet Take 1 mg by mouth 2 (two) times daily.     [provider]  clonazePAM (KLONOPIN) 0.5 MG tablet Take 0.5 mg by mouth 2 (two) times daily. scheduled    [provider]  docusate sodium (COLACE) 100 MG capsule Take 100 mg by mouth daily.    [provider]  DULoxetine (CYMBALTA) 30 MG capsule Take 90 mg by mouth daily.     [provider]  memantine (NAMENDA) 10 MG tablet Take 10 mg by mouth 2 (two) times daily.    [provider]  methocarbamol (ROBAXIN) 500 MG tablet Take 1 tablet (500 mg total) by mouth every 8 (eight) hours as needed for muscle spasms. 07/11/18  Loren RacerYelverton, David, MD  naproxen (NAPROSYN) 250 MG tablet Take 250 mg by mouth 2 (two) times daily with a meal.    [provider]  nitroGLYCERIN (NITROSTAT) 0.4 MG SL tablet Place 0.4 mg under the tongue every 5 (five) minutes as needed for chest pain.    [provider]  OLANZapine (ZYPREXA) 7.5 MG tablet Take 7.5 mg by mouth at bedtime.    [provider]  Omega-3 Fatty Acids (FISH OIL) 1000 MG CAPS Take 1 capsule by mouth.    [provider]  polyethylene glycol (MIRALAX / GLYCOLAX) packet Take 17 g by mouth daily as needed for moderate constipation. 07/11/18   Loren RacerYelverton, David, MD  Propylene Glycol (SYSTANE BALANCE) 0.6 % SOLN Place 1 drop into both eyes daily.    [provider]  simvastatin (ZOCOR)  20 MG tablet Take 20 mg by mouth at bedtime.    [provider]  traMADol (ULTRAM) 50 MG tablet Take 1 tablet (50 mg total) by mouth every 6 (six) hours as needed. Patient not taking: Reported on 07/11/2018 04/17/18   Dione BoozeGlick, David, MD    Physical Exam: Vitals:   11/01/18 2130 11/01/18 2145 11/01/18 2200 11/01/18 2230  BP: 124/83 114/74 108/73 107/71  Pulse: (!) 101 98 (!) 107 (!) 109  Resp: (!) 26 17 (!) 27 (!) 30  SpO2: 95% 98% 100% 99%  Weight:      Height:        Constitutional: NAD, calm, comfortable Eyes: PERRL, lids and conjunctivae normal ENMT: Mucous membranes are moist. Posterior pharynx clear of any exudate or lesions.Normal dentition.  Neck: normal, supple, no masses, no thyromegaly Respiratory: Tachypnea, wheezes Cardiovascular: Regular rate and rhythm, no murmurs / rubs / gallops. No extremity edema. 2+ pedal pulses. No carotid bruits.  Abdomen: no tenderness, no masses palpated. No hepatosplenomegaly. Bowel sounds positive.  Musculoskeletal: no clubbing / cyanosis. No joint deformity upper and lower extremities. Good ROM, no contractures. Normal muscle tone.  Skin: no rashes, lesions, ulcers. No induration Neurologic: CN 2-12 grossly intact. Sensation intact, DTR normal. Strength 5/5 in all 4.  Psychiatric: Normal judgment and insight. Alert and oriented x 3. Normal mood.    Labs on Admission: I have personally reviewed following labs and imaging studies  CBC: Recent Labs  Lab 11/01/18 2041  WBC 7.5  NEUTROABS 5.2  HGB 13.6  HCT 44.3  MCV 92.9  PLT 218   Basic Metabolic Panel: Recent Labs  Lab 11/01/18 2041  NA 138  K 4.1  CL 102  CO2 26  GLUCOSE 133*  BUN 15  CREATININE 0.98  CALCIUM 9.7   GFR: Estimated Creatinine Clearance: 98.7 mL/min (by C-G formula based on SCr of 0.98 mg/dL). Liver Function Tests: No results for input(s): AST, ALT, ALKPHOS, BILITOT, PROT, ALBUMIN in the last 168 hours. No results for input(s): LIPASE, AMYLASE in  the last 168 hours. No results for input(s): AMMONIA in the last 168 hours. Coagulation Profile: No results for input(s): INR, PROTIME in the last 168 hours. Cardiac Enzymes: No results for input(s): CKTOTAL, CKMB, CKMBINDEX, TROPONINI in the last 168 hours. BNP (last 3 results) No results for input(s): PROBNP in the last 8760 hours. HbA1C: No results for input(s): HGBA1C in the last 72 hours. CBG: No results for input(s): GLUCAP in the last 168 hours. Lipid Profile: No results for input(s): CHOL, HDL, LDLCALC, TRIG, CHOLHDL, LDLDIRECT in the last 72 hours. Thyroid Function Tests: No results for input(s): TSH, T4TOTAL, FREET4, T3FREE, THYROIDAB  in the last 72 hours. Anemia Panel: No results for input(s): VITAMINB12, FOLATE, FERRITIN, TIBC, IRON, RETICCTPCT in the last 72 hours. Urine analysis:    Component Value Date/Time   COLORURINE STRAW (A) 04/17/2018 0039   APPEARANCEUR CLEAR 04/17/2018 0039   LABSPEC 1.004 (L) 04/17/2018 0039   PHURINE 7.0 04/17/2018 0039   GLUCOSEU NEGATIVE 04/17/2018 0039   HGBUR NEGATIVE 04/17/2018 0039   BILIRUBINUR NEGATIVE 04/17/2018 0039   KETONESUR NEGATIVE 04/17/2018 0039   PROTEINUR NEGATIVE 04/17/2018 0039   UROBILINOGEN 1.0 03/23/2015 0024   NITRITE NEGATIVE 04/17/2018 0039   LEUKOCYTESUR NEGATIVE 04/17/2018 0039    Radiological Exams on Admission: Dg Chest 2 View  Result Date: 11/01/2018 CLINICAL DATA:  62 year old male with cough. EXAM: CHEST - 2 VIEW COMPARISON:  Chest radiograph dated 04/17/2018 FINDINGS: There is shallow inspiration. Left lung base hazy airspace densities may represent atelectasis or infiltrate. Clinical correlation is recommended. There is no pleural effusion or pneumothorax. Stable mild cardiomegaly. No acute osseous pathology. IMPRESSION: Left lung base atelectasis versus infiltrate. Electronically Signed   By: Elgie Collard M.D.   On: 11/01/2018 21:42    EKG: Independently reviewed.  Assessment/Plan Principal  Problem:   HCAP (healthcare-associated pneumonia) Active Problems:   HTN (hypertension)   Diabetes mellitus (HCC)   Acute respiratory failure with hypoxia (HCC)    1. HCAP - with new O2 requirement 1. PNA pathway 2. Cefepime / vanc 3. Cultures pending 4. Influenza pending 5. O2 via [place], cont pulse ox 2. HTN, DM2 - looks to be diet controlled, cont carb mod diet 3. Major depressive disorder with psychotic features - 1. Continue current meds  DVT prophylaxis: Lovenox Code Status: DNR - yellow form at bedside Family Communication: No family in room Disposition Plan: ALF after admit Consults called: None Admission status: Place in obs    Dutchess Crosland, Heywood Iles. DO [facility] Pager [phone] Only works nights!  If 7AM-7PM, please contact the primary day team physician taking care of patient  [email or web address] Password [email or web address]  11/02/2018, 12:01 AM

## 2018-11-02 NOTE — Progress Notes (Signed)
Paged and spoke with Tama GanderKatherine Schorr regarding pt needs for daily medications. We reviewed the list and she ordered meds for the evening. She will leave a note for her team members to review the meds tomorrow morning and order any remaining meds needed.

## 2018-11-02 NOTE — Progress Notes (Signed)
Pt resting, woke pt to assess, offer lunch tray. Provided pt with instructions and appropriate container to collect sputum sample.

## 2018-11-02 NOTE — Progress Notes (Signed)
VASCULAR LAB PRELIMINARY  PRELIMINARY  PRELIMINARY  PRELIMINARY  Left lower extremity venous duplex completed.    Preliminary report:  There is no obvious evidence of DVT or SVT noted in the visualized veins in the left lower extremity.   Kaeo Jacome, RVT 11/02/2018, 2:44 PM

## 2018-11-03 DIAGNOSIS — E119 Type 2 diabetes mellitus without complications: Secondary | ICD-10-CM | POA: Diagnosis not present

## 2018-11-03 DIAGNOSIS — Z87891 Personal history of nicotine dependence: Secondary | ICD-10-CM | POA: Diagnosis not present

## 2018-11-03 DIAGNOSIS — I10 Essential (primary) hypertension: Secondary | ICD-10-CM | POA: Diagnosis not present

## 2018-11-03 DIAGNOSIS — N189 Chronic kidney disease, unspecified: Secondary | ICD-10-CM | POA: Diagnosis present

## 2018-11-03 DIAGNOSIS — E1122 Type 2 diabetes mellitus with diabetic chronic kidney disease: Secondary | ICD-10-CM | POA: Diagnosis present

## 2018-11-03 DIAGNOSIS — Z7982 Long term (current) use of aspirin: Secondary | ICD-10-CM | POA: Diagnosis not present

## 2018-11-03 DIAGNOSIS — Z79899 Other long term (current) drug therapy: Secondary | ICD-10-CM | POA: Diagnosis not present

## 2018-11-03 DIAGNOSIS — F329 Major depressive disorder, single episode, unspecified: Secondary | ICD-10-CM | POA: Diagnosis present

## 2018-11-03 DIAGNOSIS — Z66 Do not resuscitate: Secondary | ICD-10-CM | POA: Diagnosis present

## 2018-11-03 DIAGNOSIS — I2510 Atherosclerotic heart disease of native coronary artery without angina pectoris: Secondary | ICD-10-CM | POA: Diagnosis present

## 2018-11-03 DIAGNOSIS — Y95 Nosocomial condition: Secondary | ICD-10-CM | POA: Diagnosis present

## 2018-11-03 DIAGNOSIS — Z6841 Body Mass Index (BMI) 40.0 and over, adult: Secondary | ICD-10-CM | POA: Diagnosis not present

## 2018-11-03 DIAGNOSIS — I129 Hypertensive chronic kidney disease with stage 1 through stage 4 chronic kidney disease, or unspecified chronic kidney disease: Secondary | ICD-10-CM | POA: Diagnosis present

## 2018-11-03 DIAGNOSIS — J189 Pneumonia, unspecified organism: Secondary | ICD-10-CM | POA: Diagnosis present

## 2018-11-03 DIAGNOSIS — J181 Lobar pneumonia, unspecified organism: Secondary | ICD-10-CM | POA: Diagnosis present

## 2018-11-03 DIAGNOSIS — J9601 Acute respiratory failure with hypoxia: Secondary | ICD-10-CM | POA: Diagnosis present

## 2018-11-03 DIAGNOSIS — K219 Gastro-esophageal reflux disease without esophagitis: Secondary | ICD-10-CM | POA: Diagnosis present

## 2018-11-03 DIAGNOSIS — E7800 Pure hypercholesterolemia, unspecified: Secondary | ICD-10-CM | POA: Diagnosis present

## 2018-11-03 MED ORDER — CEFDINIR 300 MG PO CAPS: 300.0000 mg | ORAL_CAPSULE | Freq: Two times a day (BID) | ORAL | 0 refills | Status: DC

## 2018-11-03 MED ORDER — ASPIRIN 81 MG PO CHEW
81.0000 mg | CHEWABLE_TABLET | Freq: Every day | ORAL | Status: DC
  Administered 2018-11-03: 81 mg via ORAL
  Filled 2018-11-03: qty 1

## 2018-11-03 MED ORDER — DULOXETINE HCL 60 MG PO CPEP
90.0000 mg | ORAL_CAPSULE | Freq: Every day | ORAL | Status: DC
  Administered 2018-11-03: 90 mg via ORAL
  Filled 2018-11-03: qty 1

## 2018-11-03 MED ORDER — TRAMADOL HCL 50 MG PO TABS: 50.0000 mg | ORAL_TABLET | Freq: Four times a day (QID) | ORAL | 0 refills | Status: DC | PRN

## 2018-11-03 MED ORDER — CLONAZEPAM 0.5 MG PO TABS: 0.5000 mg | ORAL_TABLET | Freq: Two times a day (BID) | ORAL | 0 refills | Status: DC

## 2018-11-03 NOTE — Discharge Summary (Signed)
Physician Discharge Summary  [name] ZOX:[id number] DOB: 19-Aug-1956 DOA: 11/01/2018  PCP: System, Pcp Not In  Admit date: 11/01/2018 Discharge date: 11/03/2018  Admitted From: ALF Disposition: ALF  Recommendations for Outpatient Follow-up:  1. Follow up with PCP in 1-2 weeks 2. Continue Omnicef for 5 additional days  Home Health: None Equipment/Devices: None  Discharge Condition: Stable CODE STATUS: DNR Diet recommendation: Regular  HPI: Per Dr. Julian Reil, [name] is a 61 y.o. male with medical history significant of major depressive disorder with psychosis, HTN and DM2 apparently diet controlled.  Resides in ALF. Patient presents to the ED with worsening productive cough, congestion, SOB.  Had some symptoms for past month but significantly worse over the last 3 days.  Cough meds dont seem to help. No fever, CP, headache, abd pain, N/V/D.  Hospital Course:  Principal problem Acute hypoxic respiratory failure due to lobar pneumonia -patient was admitted to the hospital with hypoxic respiratory failure in the setting of URI type symptoms, with chest x-ray showing left lung atelectasis versus infiltrate, suspicious for pneumonia given clinical symptoms.  He was placed on broad-spectrum antibiotics with vancomycin and cefepime and cultures were obtained.  He improved, his respiratory status returned to baseline, he was weaned off of oxygen and is now is comfortable on room air, cultures remain negative, will be narrowed to [facility] to finish treatment for 5 additional days.  Influenza was negative, strep pneumo was negative as well. Additional problems Hypertension -carries a diagnosis of HTN however not on any medications and currently blood pressure stable, outpatient follow-up Depression/anxiety /mild cognitive issues /history of psychosis-Resume home medications, these have been stable while hospitalized Hyperlipidemia -resume home medications Diet-controlled diabetes mellitus  -stable  Scheduled Meds:   Discharge Instructions   Allergies as of 11/03/2018   No Known Allergies     Medication List    TAKE these medications   ABILIFY MAINTENA 400 MG Srer injection Generic drug:  ARIPiprazole ER Inject 400 mg into the muscle every 3 (three) months.   aspirin 81 MG chewable tablet Chew 81 mg by mouth daily.   benztropine 1 MG tablet Commonly known as:  COGENTIN Take 1 mg by mouth 2 (two) times daily.   cefdinir 300 MG capsule Commonly known as:  OMNICEF Take 1 capsule (300 mg total) by mouth 2 (two) times daily.   clonazePAM 0.5 MG tablet Commonly known as:  KLONOPIN Take 1 tablet (0.5 mg total) by mouth 2 (two) times daily. scheduled   docusate sodium 100 MG capsule Commonly known as:  COLACE Take 100 mg by mouth daily.   DULoxetine 30 MG capsule Commonly known as:  CYMBALTA Take 90 mg by mouth daily.   Fish Oil 1000 MG Caps Take 1 capsule by mouth every evening.   guaifenesin 100 MG/5ML syrup Commonly known as:  ROBITUSSIN Take 300 mg by mouth every 6 (six) hours as needed for cough.   memantine 10 MG tablet Commonly known as:  NAMENDA Take 10 mg by mouth 2 (two) times daily.   methocarbamol 500 MG tablet Commonly known as:  ROBAXIN Take 1 tablet (500 mg total) by mouth every 8 (eight) hours as needed for muscle spasms.   nitroGLYCERIN 0.4 MG SL tablet Commonly known as:  NITROSTAT Place 0.4 mg under the tongue every 5 (five) minutes as needed for chest pain.   OLANZapine 7.5 MG tablet Commonly known as:  ZYPREXA Take 7.5 mg by mouth at bedtime.   polyethylene glycol packet Commonly known as:  MIRALAX / GLYCOLAX Take 17 g by mouth daily as needed for moderate constipation.   simvastatin 20 MG tablet Commonly known as:  ZOCOR Take 20 mg by mouth at bedtime.   SYSTANE BALANCE 0.6 % Soln Generic drug:  Propylene Glycol Place 1 drop into both eyes daily.   traMADol 50 MG tablet Commonly known as:  ULTRAM Take 1 tablet  (50 mg total) by mouth every 6 (six) hours as needed for moderate pain.      Consultations:  None   Procedures/Studies:  Dg Chest 2 View  Result Date: 11/01/2018 CLINICAL DATA:  62 year old male with cough. EXAM: CHEST - 2 VIEW COMPARISON:  Chest radiograph dated 04/17/2018 FINDINGS: There is shallow inspiration. Left lung base hazy airspace densities may represent atelectasis or infiltrate. Clinical correlation is recommended. There is no pleural effusion or pneumothorax. Stable mild cardiomegaly. No acute osseous pathology. IMPRESSION: Left lung base atelectasis versus infiltrate. Electronically Signed   By: Elgie Collard M.D.   On: 11/01/2018 21:42   Vas [place] Lower Extremity Venous (dvt)  Result Date: 11/02/2018  Lower Venous Study Indications: SOB.  Limitations: Body habitus. Comparison Study: No prior study on file Performing Technologist: Sherren Kerns RVS  Examination Guidelines: A complete evaluation includes B-mode imaging, spectral Doppler, color Doppler, and power Doppler as needed of all accessible portions of each vessel. Bilateral testing is considered an integral part of a complete examination. Limited examinations for reoccurring indications may be performed as noted.  Right Technical Findings: Right leg not evaluated.  Left Venous Findings: +---------+---------------+---------+-----------+----------------+-------+          CompressibilityPhasicitySpontaneityProperties      Summary +---------+---------------+---------+-----------+----------------+-------+ CFV      Full           Yes      Yes        softly echogenic        +---------+---------------+---------+-----------+----------------+-------+ SFJ      Full                                                       +---------+---------------+---------+-----------+----------------+-------+ FV Prox  Full                                                        +---------+---------------+---------+-----------+----------------+-------+ FV Mid   Full                                                       +---------+---------------+---------+-----------+----------------+-------+ FV DistalFull                                                       +---------+---------------+---------+-----------+----------------+-------+ PFV      Full                                                       +---------+---------------+---------+-----------+----------------+-------+  POP      Full           Yes      Yes                                +---------+---------------+---------+-----------+----------------+-------+ PTV      Full                                                       +---------+---------------+---------+-----------+----------------+-------+  Left Technical Findings: Not visualized segments include peroneal vein.   Summary: Left: There is no evidence of deep vein thrombosis in the lower extremity.  *See table(s) above for measurements and observations. Electronically signed by Sherald Hesshristopher Clark MD on 11/02/2018 at 3:09:11 PM.    Final      Subjective: - no chest pain, shortness of breath, no abdominal pain, nausea or vomiting.  Wants to go home  Discharge Exam: Vitals:   11/03/18 0455 11/03/18 0820  BP: 119/62 122/65  Pulse: 79 82  Resp: (!) 21 20  Temp: (!) 97.5 F (36.4 C)   SpO2: 94% 96%    General: Pt is alert, awake, not in acute distress Cardiovascular: RRR, S1/S2 +, no rubs, no gallops Respiratory: CTA bilaterally, no wheezing, no rhonchi Abdominal: Soft, NT, ND, bowel sounds + Extremities: no edema, no cyanosis    The results of significant diagnostics from this hospitalization (including imaging, microbiology, ancillary and laboratory) are listed below for reference.     Microbiology: Recent Results (from the past 240 hour(s))  Culture, blood (routine x 2) Call MD if unable to obtain prior to antibiotics  being given     Status: None (Preliminary result)   Collection Time: 11/01/18 11:54 PM  Result Value Ref Range Status   Specimen Description BLOOD LEFT ANTECUBITAL  Final   Special Requests   Final    BOTTLES DRAWN AEROBIC AND ANAEROBIC Blood Culture results may not be optimal due to an inadequate volume of blood received in culture bottles   Culture   Final    NO GROWTH 1 DAY Performed at [facility], [address] [place], [place] [address]    Report Status PENDING  Incomplete  Culture, blood (routine x 2) Call MD if unable to obtain prior to antibiotics being given     Status: None (Preliminary result)   Collection Time: 11/01/18 11:58 PM  Result Value Ref Range Status   Specimen Description BLOOD BLOOD LEFT FOREARM  Final   Special Requests   Final    BOTTLES DRAWN AEROBIC AND ANAEROBIC Blood Culture results may not be optimal due to an inadequate volume of blood received in culture bottles   Culture   Final    NO GROWTH 1 DAY Performed at [facility], [address] [place], [place] [address]    Report Status PENDING  Incomplete  Culture, sputum-assessment     Status: None   Collection Time: 11/02/18  1:40 PM  Result Value Ref Range Status   Specimen Description EXPECTORATED SPUTUM  Final   Special Requests NONE  Final   Sputum evaluation   Final    Sputum specimen not acceptable for testing.  Please recollect.   RESULT CALLED TO, READ BACK BY AND VERIFIED WITH: Donnetta HailE TAYLOR RN  11/02/18 2020 JDW Performed at [facility], [address], [place], [place] [address]    Report Status 11/02/2018 FINAL  Final     Labs: BNP (last 3 results) Recent Labs    11/01/18 2046  BNP 18.9   Basic Metabolic Panel: Recent Labs  Lab 11/01/18 2041  NA 138  K 4.1  CL 102  CO2 26  GLUCOSE 133*  BUN 15  CREATININE 0.98  CALCIUM 9.7   Liver Function Tests: No results for input(s): AST, ALT, ALKPHOS, BILITOT, PROT, ALBUMIN in the last 168 hours. No results for  input(s): LIPASE, AMYLASE in the last 168 hours. No results for input(s): AMMONIA in the last 168 hours. CBC: Recent Labs  Lab 11/01/18 2041  WBC 7.5  NEUTROABS 5.2  HGB 13.6  HCT 44.3  MCV 92.9  PLT 218   Cardiac Enzymes: No results for input(s): CKTOTAL, CKMB, CKMBINDEX, TROPONINI in the last 168 hours. BNP: Invalid input(s): POCBNP CBG: No results for input(s): GLUCAP in the last 168 hours. D-Dimer No results for input(s): DDIMER in the last 72 hours. Hgb A1c No results for input(s): HGBA1C in the last 72 hours. Lipid Profile No results for input(s): CHOL, HDL, LDLCALC, TRIG, CHOLHDL, LDLDIRECT in the last 72 hours. Thyroid function studies No results for input(s): TSH, T4TOTAL, T3FREE, THYROIDAB in the last 72 hours.  Invalid input(s): FREET3 Anemia work up No results for input(s): VITAMINB12, FOLATE, FERRITIN, TIBC, IRON, RETICCTPCT in the last 72 hours. Urinalysis    Component Value Date/Time   COLORURINE YELLOW 11/01/2018 2344   APPEARANCEUR CLEAR 11/01/2018 2344   LABSPEC 1.011 11/01/2018 2344   PHURINE 6.0 11/01/2018 2344   GLUCOSEU NEGATIVE 11/01/2018 2344   HGBUR NEGATIVE 11/01/2018 2344   BILIRUBINUR NEGATIVE 11/01/2018 2344   KETONESUR NEGATIVE 11/01/2018 2344   PROTEINUR NEGATIVE 11/01/2018 2344   UROBILINOGEN 1.0 03/23/2015 0024   NITRITE NEGATIVE 11/01/2018 2344   LEUKOCYTESUR NEGATIVE 11/01/2018 2344   Sepsis Labs Invalid input(s): PROCALCITONIN,  WBC,  LACTICIDVEN   Time coordinating discharge: 40 minutes  SIGNED:  Pamella Pert, MD  [facility] 11/03/2018, 1:26 PM Pager [phone]  If 7PM-7AM, please contact night-coverage [email or web address] Password [email or web address]

## 2018-11-03 NOTE — Progress Notes (Signed)
[facility] here to transport patient to [facility] via Doctor, general practicestretcher. VSS. Discharge instructions given. Verbalizes understanding.

## 2018-11-03 NOTE — [place] FL2 (Signed)
[place] MEDICAID FL2 LEVEL OF CARE SCREENING TOOL     IDENTIFICATION  Patient Name: [name] Birthdate: 10/16/1956 Sex: male Admission Date (Current Location): 11/01/2018  [place]  [place] [id number] P Facility and Address:  [facility] [place], [address], [place], [place] [address]      Provider Number: [id number]  Attending Physician Name and Address:  Leatha GildingGherghe, Costin M, MD  Relative Name and Phone Number:  Gwenlyn FoundKatinna Calls - niece, [phone]    Current Level of Care: Hospital Recommended Level of Care: Assisted Living Facility([facility]) Prior Approval Number:    Date Approved/Denied:   PASRR Number:    Discharge Plan: Other (Comment)([facility])    Current Diagnoses: Patient Active Problem List   Diagnosis Date Noted  . Acute respiratory failure with hypoxia (HCC) 11/01/2018  . HCAP (healthcare-associated pneumonia) 11/01/2018  . HTN (hypertension) 09/10/2014  . Diabetes mellitus (HCC) 09/10/2014  . Chronic cholecystitis with calculus 09/09/2014  . 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 Symptomatic cholelithiasis 09/21/2012    Orientation RESPIRATION BLADDER Height & Weight     Self, Time, Situation, Place  Normal(Patient weaned from 3L O2) Continent Weight: 272 lb (123.4 kg) Height:  5\' 8"  (172.7 cm)  BEHAVIORAL SYMPTOMS/MOOD NEUROLOGICAL BOWEL NUTRITION STATUS      Continent Diet(Low salt, low cholestereol, no fried foods)  AMBULATORY STATUS COMMUNICATION OF NEEDS Skin   Independent(Uses a walker) Verbally Other (Comment)(MASD to buttocks, treated with barrier cream)                       Personal Care Assistance Level of Assistance  Bathing, Feeding, Dressing Bathing Assistance: Independent Feeding assistance: Independent Dressing Assistance: Independent     Functional Limitations Info  Sight, Hearing, Speech Sight Info: Adequate Hearing Info:  Impaired Speech Info: Adequate    SPECIAL CARE FACTORS FREQUENCY                       Contractures      Additional Factors Info  Code Status, Allergies Code Status Info: DNR Allergies Info: No known allergies           Current Medications (11/03/2018):  This is the current hospital active medication list Current Facility-Administered Medications  Medication Dose Route Frequency Provider Last Rate Last Dose  . benztropine (COGENTIN) tablet 1 mg  1 mg Oral BID Schorr, Roma KayserKatherine P, NP   1 mg at 11/03/18 1143  . ceFEPIme (MAXIPIME) 1 g in sodium chloride 0.9 % 100 mL IVPB  1 g Intravenous Q8H Alvira MondaySchlossman, Erin, MD 200 mL/hr at 11/03/18 0903 1 g at 11/03/18 0903  . clonazePAM (KLONOPIN) tablet 0.5 mg  0.5 mg Oral BID Schorr, Roma KayserKatherine P, NP   0.5 mg at 11/03/18 1143  . dextromethorphan-guaiFENesin (MUCINEX DM) 30-600 MG per 12 hr tablet 1 tablet  1 tablet Oral BID Pokhrel, Laxman, MD   1 tablet at 11/03/18 1142  . enoxaparin (LOVENOX) injection 40 mg  40 mg Subcutaneous Daily Lyda PeroneGardner, Jared M, DO   40 mg at 11/03/18 1142  . MEDLINE mouth rinse  15 mL Mouth Rinse BID Hillary BowGardner, Jared M, DO   15 mL at 11/02/18 2207  . memantine (NAMENDA) tablet 10 mg  10 mg Oral BID Schorr, Roma KayserKatherine P, NP   10 mg at 11/03/18 1143  . OLANZapine (ZYPREXA) tablet 7.5 mg  7.5 mg Oral QHS Schorr, Roma KayserKatherine P, NP  7.5 mg at 11/02/18 2151  . simvastatin (ZOCOR) tablet 20 mg  20 mg Oral QHS Schorr, Roma Kayser, NP   20 mg at 11/02/18 2150  . traMADol (ULTRAM) tablet 50 mg  50 mg Oral Q6H PRN Schorr, Roma Kayser, NP      . vancomycin (VANCOCIN) IVPB 1000 mg/200 mL premix  1,000 mg Intravenous Q12H Alvira Monday, MD 200 mL/hr at 11/03/18 1141 1,000 mg at 11/03/18 1141     Discharge Medications: Please see discharge summary for a list of discharge medications.  Relevant Imaging Results:  Relevant Lab Results:   Additional Information DISCHARGE MEDICATIONS:  TAKE these medications   ABILIFY  MAINTENA 400 MG Srer injection Generic drug:  ARIPiprazole ER Inject 400 mg into the muscle every 3 (three) months.   aspirin 81 MG chewable tablet Chew 81 mg by mouth daily.   benztropine 1 MG tablet Commonly known as:  COGENTIN Take 1 mg by mouth 2 (two) times daily.   cefdinir 300 MG capsule Commonly known as:  OMNICEF Take 1 capsule (300 mg total) by mouth 2 (two) times daily.   clonazePAM 0.5 MG tablet Commonly known as:  KLONOPIN Take 1 tablet (0.5 mg total) by mouth 2 (two) times daily. scheduled   docusate sodium 100 MG capsule Commonly known as:  COLACE Take 100 mg by mouth daily.   DULoxetine 30 MG capsule Commonly known as:  CYMBALTA Take 90 mg by mouth daily.   Fish Oil 1000 MG Caps Take 1 capsule by mouth every evening.   guaifenesin 100 MG/5ML syrup Commonly known as:  ROBITUSSIN Take 300 mg by mouth every 6 (six) hours as needed for cough.   memantine 10 MG tablet Commonly known as:  NAMENDA Take 10 mg by mouth 2 (two) times daily.   methocarbamol 500 MG tablet Commonly known as:  ROBAXIN Take 1 tablet (500 mg total) by mouth every 8 (eight) hours as needed for muscle spasms.   nitroGLYCERIN 0.4 MG SL tablet Commonly known as:  NITROSTAT Place 0.4 mg under the tongue every 5 (five) minutes as needed for chest pain.   OLANZapine 7.5 MG tablet Commonly known as:  ZYPREXA Take 7.5 mg by mouth at bedtime.   polyethylene glycol packet Commonly known as:  MIRALAX / GLYCOLAX Take 17 g by mouth daily as needed for moderate constipation.   simvastatin 20 MG tablet Commonly known as:  ZOCOR Take 20 mg by mouth at bedtime.   SYSTANE BALANCE 0.6 % Soln Generic drug:  Propylene Glycol Place 1 drop into both eyes daily.   traMADol 50 MG tablet Commonly known as:  ULTRAM Take 1 tablet (50 mg total) by mouth every 6 (six) hours as needed for moderate pain.        Cristobal Goldmann, LCSW

## 2018-11-03 NOTE — Clinical Social Work Note (Signed)
Patient medically stable for discharge and will return to [facility]. Facility contacted and CSW informed Clydie BraunKaren regarding discharge and clinicals transmitted. Patient did not want family contacted. CSW signing off as no other SW intervention services needed.  Genelle BalVanessa Raydel Hosick, MSW, LCSW Licensed Clinical Social Worker Clinical Social Work Department [facility] [phone]

## 2018-11-03 NOTE — Clinical Social Work Note (Signed)
Clinical Social Work Assessment  Patient Details  Name: [name] MRN: [id number] Date of Birth: 11/25/1955  Date of referral:  11/03/18               Reason for consult:  Discharge Planning                Permission sought to share information with:  Family Supports Permission granted to share information::  No  Name::        Agency::     Relationship::     Contact Information:     Housing/Transportation Living arrangements for the past 2 months:  Assisted Living Facility[facility] Source of Information:  Facility, Patient Patient Interpreter Needed:  None Criminal Activity/Legal Involvement Pertinent to Current Situation/Hospitalization:  No - Comment as needed Significant Relationships:  None Lives with:  Facility Resident Do you feel safe going back to the place where you live?  Yes Need for family participation in patient care:  No (Coment)  Care giving concerns:  Patient expressed no concerns regarding his care at [facility].  Social Worker assessment / plan: CSW talked with patient at the bedside regarding his discharge disposition. Mr. Olen Cordialabst confirmed that [facility] and has been there for [facility] years. When asked, patient indicated that he did not have any family to be contacted and when CSW asked patient about Ms. Calls (niece-shown in [place]), patient indicated that he does not want her contacted regarding his discharge.  Employment status:  Disabled (Comment on whether or not currently receiving Disability) Insurance information:  [place] Recommendations:  Not assessed at this time Information / Referral to community resources:  Other (Comment Required)(None needed or requested as patient from ALF)  Patient/Family's Response to care:  Patient reported no concerns regarding his care during hospitalization.  Patient/Family's Understanding of and Emotional Response to Diagnosis, Current Treatment, and Prognosis:  Not  discussed.  Emotional Assessment Appearance:  Appears older than stated age Attitude/Demeanor/Rapport:  Engaged Affect (typically observed):  Appropriate Orientation:  Oriented to Self, Oriented to Place, Oriented to  Time, Oriented to Situation Alcohol / Substance use:  Tobacco Use, Alcohol Use, Illicit Drugs(Patient reported that he quit smoking and does not drink or use illicit drugs) Psych involvement (Current and /or in the community):  No (Comment)  Discharge Needs  Concerns to be addressed:  Discharge Planning Concerns Readmission within the last 30 days:  No Current discharge risk:  None Barriers to Discharge:  No Barriers Identified   Cristobal GoldmannCrawford, Corri Delapaz Bradley, LCSW 11/03/2018, 12:54 PM

## 2018-11-03 NOTE — Progress Notes (Signed)
Report called and given to CarneyShantell, Charity fundraiserN at [facility].

## 2018-11-07 LAB — CULTURE, BLOOD (ROUTINE X 2)
Culture: NO GROWTH
Culture: NO GROWTH

## 2023-12-26 ENCOUNTER — Other Ambulatory Visit: Payer: Self-pay

## 2023-12-26 ENCOUNTER — Emergency Department (HOSPITAL_COMMUNITY)
Admission: EM | Admit: 2023-12-26 | Discharge: 2023-12-27 | Disposition: A | Discharge status: Skilled Nursing Facility | Payer: Medicare Other | Attending: Emergency Medicine | Admitting: Emergency Medicine

## 2023-12-26 ENCOUNTER — Encounter (HOSPITAL_COMMUNITY): Payer: Self-pay

## 2023-12-26 ENCOUNTER — Emergency Department (HOSPITAL_COMMUNITY): Payer: Medicare Other

## 2023-12-26 DIAGNOSIS — Z7982 Long term (current) use of aspirin: Secondary | ICD-10-CM | POA: Insufficient documentation

## 2023-12-26 DIAGNOSIS — I2510 Atherosclerotic heart disease of native coronary artery without angina pectoris: Secondary | ICD-10-CM | POA: Diagnosis not present

## 2023-12-26 DIAGNOSIS — R053 Chronic cough: Secondary | ICD-10-CM | POA: Insufficient documentation

## 2023-12-26 DIAGNOSIS — I10 Essential (primary) hypertension: Secondary | ICD-10-CM | POA: Diagnosis not present

## 2023-12-26 DIAGNOSIS — R059 Cough, unspecified: Secondary | ICD-10-CM | POA: Diagnosis present

## 2023-12-26 DIAGNOSIS — Z20822 Contact with and (suspected) exposure to covid-19: Secondary | ICD-10-CM | POA: Diagnosis not present

## 2023-12-26 DIAGNOSIS — E119 Type 2 diabetes mellitus without complications: Secondary | ICD-10-CM | POA: Diagnosis not present

## 2023-12-26 LAB — URINALYSIS, ROUTINE W REFLEX MICROSCOPIC
Bilirubin Urine: NEGATIVE
Glucose, UA: NEGATIVE mg/dL
Ketones, ur: NEGATIVE mg/dL
Leukocytes,Ua: NEGATIVE
Nitrite: NEGATIVE
Protein, ur: 30 mg/dL — AB
Specific Gravity, Urine: 1.027 (ref 1.005–1.030)
pH: 5 (ref 5.0–8.0)

## 2023-12-26 LAB — RESP PANEL BY RT-PCR (RSV, FLU A&B, COVID)  RVPGX2
Influenza A by PCR: NEGATIVE
Influenza B by PCR: NEGATIVE
Resp Syncytial Virus by PCR: NEGATIVE
SARS Coronavirus 2 by RT PCR: NEGATIVE

## 2023-12-26 LAB — CBC WITH DIFFERENTIAL/PLATELET
Abs Immature Granulocytes: 0.22 10*3/uL — ABNORMAL HIGH (ref 0.00–0.07)
Basophils Absolute: 0 10*3/uL (ref 0.0–0.1)
Basophils Relative: 0 %
Eosinophils Absolute: 0 10*3/uL (ref 0.0–0.5)
Eosinophils Relative: 0 %
HCT: 40.6 % (ref 39.0–52.0)
Hemoglobin: 12.4 g/dL — ABNORMAL LOW (ref 13.0–17.0)
Immature Granulocytes: 2 %
Lymphocytes Relative: 7 %
Lymphs Abs: 1 10*3/uL (ref 0.7–4.0)
MCH: 28.9 pg (ref 26.0–34.0)
MCHC: 30.5 g/dL (ref 30.0–36.0)
MCV: 94.6 fL (ref 80.0–100.0)
Monocytes Absolute: 1.1 10*3/uL — ABNORMAL HIGH (ref 0.1–1.0)
Monocytes Relative: 8 %
Neutro Abs: 11.4 10*3/uL — ABNORMAL HIGH (ref 1.7–7.7)
Neutrophils Relative %: 83 %
Platelets: 177 10*3/uL (ref 150–400)
RBC: 4.29 MIL/uL (ref 4.22–5.81)
RDW: 14.4 % (ref 11.5–15.5)
WBC: 13.8 10*3/uL — ABNORMAL HIGH (ref 4.0–10.5)
nRBC: 0 % (ref 0.0–0.2)

## 2023-12-26 LAB — COMPREHENSIVE METABOLIC PANEL
ALT: 22 U/L (ref 0–44)
AST: 26 U/L (ref 15–41)
Albumin: 3.5 g/dL (ref 3.5–5.0)
Alkaline Phosphatase: 63 U/L (ref 38–126)
Anion gap: 13 (ref 5–15)
BUN: 24 mg/dL — ABNORMAL HIGH (ref 8–23)
CO2: 25 mmol/L (ref 22–32)
Calcium: 8.9 mg/dL (ref 8.9–10.3)
Chloride: 100 mmol/L (ref 98–111)
Creatinine, Ser: 0.98 mg/dL (ref 0.61–1.24)
GFR, Estimated: >60 mL/min (ref >60)
Glucose, Bld: 159 mg/dL — ABNORMAL HIGH (ref 70–99)
Potassium: 3.6 mmol/L (ref 3.5–5.1)
Sodium: 138 mmol/L (ref 135–145)
Total Bilirubin: 1.3 mg/dL — ABNORMAL HIGH (ref 0.0–1.2)
Total Protein: 7.2 g/dL (ref 6.5–8.1)

## 2023-12-26 MED ORDER — BENZONATATE 100 MG PO CAPS: 100.0000 mg | ORAL_CAPSULE | Freq: Three times a day (TID) | ORAL | 0 refills | Status: DC

## 2023-12-26 NOTE — ED Provider Notes (Signed)
Patient signed out at the change of shift awaiting a urinalysis to rule out urinary tract infection and an x-ray and flu swabs, there is no signs of pneumonia, no signs of cystitis, metabolic panel reassuring, CBC with nonspecific leukocytosis, patient otherwise appears well   Eber Hong, MD 12/26/23 1721

## 2023-12-26 NOTE — Discharge Instructions (Signed)
Signs of pneumonia on the x-ray, no urinary infection  Drink plenty of clear liquids  Tessalon is a cough medication that helps reduce the amount of coughing that you are having.  You may take up to 200 mg every 8 hours as needed.  This can be used safely with most or other over-the-counter medications but talk to your pharmacist before taking anything else over-the-counter with it  Thank you for allowing [place] to treat you in the emergency department today.  After reviewing your examination and potential testing that was done it appears that you are safe to go home.  I would like for you to follow-up with your doctor within the next several days, have them obtain your records and follow-up with them to review all potential tests and results from your visit.  If you should develop severe or worsening symptoms return to the emergency department immediately

## 2023-12-26 NOTE — ED Notes (Signed)
[facility] called to transport patient back to facility.

## 2023-12-26 NOTE — ED Triage Notes (Signed)
Pt sent from facility for urinary frequency.

## 2023-12-26 NOTE — ED Notes (Signed)
Tried to call patient facility and was sent to voice mail and it was full so I could not leave a message.

## 2023-12-26 NOTE — ED Provider Notes (Signed)
[place] EMERGENCY DEPARTMENT AT [facility] Provider Note   CSN: [id number] Arrival date & time: 12/26/23  1154     History  Chief Complaint  Patient presents with   Urinary Frequency    [name] is a 68 y.o. male.  Pt is a 68 yo male with pmhx significant for htn, dm, hld, gerd, psychosis, cad, and anemia.  Pt is sent in from his facility due to urinary frequency.  He said he's been coughing a lot, but that has been going on for weeks.  He has some bilateral leg pain which has also been chronic.  No fevers.        Home Medications Prior to Admission medications   Medication Sig Start Date End Date Taking? Authorizing Provider  ARIPiprazole ER (ABILIFY MAINTENA) 400 MG SRER injection Inject 400 mg into the muscle every 3 (three) months.    [provider]  aspirin 81 MG chewable tablet Chew 81 mg by mouth daily.    [provider]  benztropine (COGENTIN) 1 MG tablet Take 1 mg by mouth 2 (two) times daily.     [provider]  cefdinir (OMNICEF) 300 MG capsule Take 1 capsule (300 mg total) by mouth 2 (two) times daily. 11/03/18   Leatha Gilding, MD  clonazePAM (KLONOPIN) 0.5 MG tablet Take 1 tablet (0.5 mg total) by mouth 2 (two) times daily. scheduled 11/03/18   Leatha Gilding, MD  docusate sodium (COLACE) 100 MG capsule Take 100 mg by mouth daily.    [provider]  DULoxetine (CYMBALTA) 30 MG capsule Take 90 mg by mouth daily.     [provider]  guaifenesin (ROBITUSSIN) 100 MG/5ML syrup Take 300 mg by mouth every 6 (six) hours as needed for cough.    [provider]  memantine (NAMENDA) 10 MG tablet Take 10 mg by mouth 2 (two) times daily.    [provider]  methocarbamol (ROBAXIN) 500 MG tablet Take 1 tablet (500 mg total) by mouth every 8 (eight) hours as needed for muscle spasms. 07/11/18   Loren Racer, MD  nitroGLYCERIN (NITROSTAT) 0.4 MG SL tablet Place 0.4 mg under the tongue every  5 (five) minutes as needed for chest pain.    [provider]  OLANZapine (ZYPREXA) 7.5 MG tablet Take 7.5 mg by mouth at bedtime.    [provider]  Omega-3 Fatty Acids (FISH OIL) 1000 MG CAPS Take 1 capsule by mouth every evening.     [provider]  polyethylene glycol (MIRALAX / GLYCOLAX) packet Take 17 g by mouth daily as needed for moderate constipation. 07/11/18   Loren Racer, MD  Propylene Glycol (SYSTANE BALANCE) 0.6 % SOLN Place 1 drop into both eyes daily.    [provider]  simvastatin (ZOCOR) 20 MG tablet Take 20 mg by mouth at bedtime.    [provider]  traMADol (ULTRAM) 50 MG tablet Take 1 tablet (50 mg total) by mouth every 6 (six) hours as needed for moderate pain. 11/03/18   Leatha Gilding, MD      Allergies    Patient has no known allergies.    Review of Systems   Review of Systems  Genitourinary:  Positive for dysuria and frequency.  All other systems reviewed and are negative.   Physical Exam Updated Vital Signs BP 123/61   Pulse 78   Temp 98.2 F (36.8 C) (Oral)   Resp 16   Ht 5\' 8"  (1.727 m)  Wt 113.4 kg   SpO2 97%   BMI 38.01 kg/m  Physical Exam Vitals and nursing note reviewed. Exam conducted with a chaperone present.  Constitutional:      Appearance: Normal appearance.  HENT:     Head: Normocephalic and atraumatic.     Right Ear: External ear normal.     Left Ear: External ear normal.     Nose: Nose normal.     Mouth/Throat:     Mouth: Mucous membranes are moist.     Pharynx: Oropharynx is clear.  Eyes:     Extraocular Movements: Extraocular movements intact.     Conjunctiva/sclera: Conjunctivae normal.     Pupils: Pupils are equal, round, and reactive to light.  Cardiovascular:     Rate and Rhythm: Normal rate and regular rhythm.     Pulses: Normal pulses.     Heart sounds: Normal heart sounds.  Pulmonary:     Effort: Pulmonary effort is normal.     Breath sounds: Normal breath  sounds.  Abdominal:     General: Abdomen is flat. Bowel sounds are normal.     Palpations: Abdomen is soft.  Genitourinary:    Penis: Uncircumcised.   Musculoskeletal:        General: Normal range of motion.     Cervical back: Normal range of motion and neck supple.  Skin:    General: Skin is warm.     Capillary Refill: Capillary refill takes less than 2 seconds.  Neurological:     General: No focal deficit present.     Mental Status: He is alert and oriented to person, place, and time.  Psychiatric:        Mood and Affect: Mood normal.        Behavior: Behavior normal.     ED Results / Procedures / Treatments   Labs (all labs ordered are listed, but only abnormal results are displayed) Labs Reviewed  COMPREHENSIVE METABOLIC PANEL - Abnormal; Notable for the following components:      Result Value   Glucose, Bld 159 (*)    BUN 24 (*)    Total Bilirubin 1.3 (*)    All other components within normal limits  CBC WITH DIFFERENTIAL/PLATELET - Abnormal; Notable for the following components:   WBC 13.8 (*)    Hemoglobin 12.4 (*)    Neutro Abs 11.4 (*)    Monocytes Absolute 1.1 (*)    Abs Immature Granulocytes 0.22 (*)    All other components within normal limits  RESP PANEL BY RT-PCR (RSV, FLU A&B, COVID)  [id number]  URINALYSIS, ROUTINE W REFLEX MICROSCOPIC    EKG None  Radiology DG Chest 1 View Result Date: 12/26/2023 CLINICAL DATA:  Cough.  Urinary frequency. EXAM: CHEST  1 VIEW COMPARISON:  Radiographs 11/01/2018 and 04/17/2018. FINDINGS: 1302 hours. Lower lung volumes with vascular congestion, probable mild edema and mild bibasilar atelectasis. No confluent airspace disease, pneumothorax or significant pleural effusion identified. The heart size and mediastinal contours are stable. The bones appear unchanged. IMPRESSION: Lower lung volumes with vascular congestion, probable mild edema and mild bibasilar atelectasis. Electronically Signed   By: Carey Bullocks M.D.   On:  12/26/2023 14:55    Procedures Procedures    Medications Ordered in ED Medications - No data to display  ED Course/ Medical Decision Making/ A&P  Medical Decision Making Amount and/or Complexity of Data Reviewed Labs: ordered. Radiology: ordered.   This patient presents to the ED for concern of urinary frequency, this involves an extensive number of treatment options, and is a complaint that carries with it a high risk of complications and morbidity.  The differential diagnosis includes uti, electrolyte abn, infection   Co morbidities that complicate the patient evaluation  htn, dm, hld, gerd, psychosis, cad, and anemia   Additional history obtained:  Additional history obtained from epic chart review External records from outside source obtained and reviewed including EMS report   Lab Tests:  I Ordered, and personally interpreted labs.  The pertinent results include:  covid/flu/rsv neg; cbc with wbc elevated at 13.8, cmp nl; urine pending at shift change.   Imaging Studies ordered:  I ordered imaging studies including cxr  I independently visualized and interpreted imaging which showed  Lower lung volumes with vascular congestion, probable mild edema and  mild bibasilar atelectasis.   I agree with the radiologist interpretation   Medicines ordered and prescription drug management:   I have reviewed the patients home medicines and have made adjustments as needed   Problem List / ED Course:  Cough:  chronic.  Likely from tobacco abuse.  No covid/flu/rsv/pna.   Reevaluation:  After the interventions noted above, I reevaluated the patient and found that they have :improved   Social Determinants of Health:  Lives at facility   Dispostion:  After consideration of the diagnostic results and the patients response to treatment, I feel that the patent would benefit from likely d/c.          Final Clinical  Impression(s) / ED Diagnoses Final diagnoses:  Chronic cough    Rx / DC Orders ED Discharge Orders     None         Jacalyn Lefevre, MD 12/26/23 1536

## 2023-12-28 ENCOUNTER — Telehealth (HOSPITAL_COMMUNITY): Payer: Self-pay | Admitting: Emergency Medicine

## 2023-12-28 MED ORDER — BENZONATATE 100 MG PO CAPS: 100.0000 mg | ORAL_CAPSULE | Freq: Three times a day (TID) | ORAL | 0 refills | Status: DC

## 2023-12-28 NOTE — Telephone Encounter (Signed)
 Patient requesting to the benzonatate  be sent to pharmacy on file.

## 2024-01-03 ENCOUNTER — Other Ambulatory Visit: Payer: Self-pay

## 2024-01-03 ENCOUNTER — Encounter (HOSPITAL_COMMUNITY): Payer: Self-pay | Admitting: Emergency Medicine

## 2024-01-03 ENCOUNTER — Emergency Department (HOSPITAL_COMMUNITY): Payer: Medicare Other

## 2024-01-03 ENCOUNTER — Emergency Department (HOSPITAL_COMMUNITY)
Admission: EM | Admit: 2024-01-03 | Discharge: 2024-01-03 | Disposition: A | Discharge status: Home / Self Care | Payer: Medicare Other | Attending: Emergency Medicine | Admitting: Emergency Medicine

## 2024-01-03 DIAGNOSIS — W06XXXA Fall from bed, initial encounter: Secondary | ICD-10-CM | POA: Insufficient documentation

## 2024-01-03 DIAGNOSIS — S0990XA Unspecified injury of head, initial encounter: Secondary | ICD-10-CM | POA: Diagnosis present

## 2024-01-03 DIAGNOSIS — M549 Dorsalgia, unspecified: Secondary | ICD-10-CM | POA: Diagnosis not present

## 2024-01-03 DIAGNOSIS — Z7982 Long term (current) use of aspirin: Secondary | ICD-10-CM | POA: Diagnosis not present

## 2024-01-03 DIAGNOSIS — S0091XA Abrasion of unspecified part of head, initial encounter: Secondary | ICD-10-CM | POA: Insufficient documentation

## 2024-01-03 DIAGNOSIS — T148XXA Other injury of unspecified body region, initial encounter: Secondary | ICD-10-CM

## 2024-01-03 DIAGNOSIS — M542 Cervicalgia: Secondary | ICD-10-CM | POA: Insufficient documentation

## 2024-01-03 DIAGNOSIS — Z4800 Encounter for change or removal of nonsurgical wound dressing: Secondary | ICD-10-CM | POA: Insufficient documentation

## 2024-01-03 DIAGNOSIS — Z5189 Encounter for other specified aftercare: Secondary | ICD-10-CM

## 2024-01-03 DIAGNOSIS — W19XXXA Unspecified fall, initial encounter: Secondary | ICD-10-CM

## 2024-01-03 LAB — MAGNESIUM: Magnesium: 1.7 mg/dL (ref 1.7–2.4)

## 2024-01-03 LAB — COMPREHENSIVE METABOLIC PANEL
ALT: 18 U/L (ref 0–44)
AST: 20 U/L (ref 15–41)
Albumin: 3 g/dL — ABNORMAL LOW (ref 3.5–5.0)
Alkaline Phosphatase: 53 U/L (ref 38–126)
Anion gap: 11 (ref 5–15)
BUN: 24 mg/dL — ABNORMAL HIGH (ref 8–23)
CO2: 26 mmol/L (ref 22–32)
Calcium: 8.8 mg/dL — ABNORMAL LOW (ref 8.9–10.3)
Chloride: 104 mmol/L (ref 98–111)
Creatinine, Ser: 0.81 mg/dL (ref 0.61–1.24)
GFR, Estimated: >60 mL/min (ref >60)
Glucose, Bld: 129 mg/dL — ABNORMAL HIGH (ref 70–99)
Potassium: 3.6 mmol/L (ref 3.5–5.1)
Sodium: 141 mmol/L (ref 135–145)
Total Bilirubin: 0.7 mg/dL (ref 0.0–1.2)
Total Protein: 6.4 g/dL — ABNORMAL LOW (ref 6.5–8.1)

## 2024-01-03 LAB — CBC WITH DIFFERENTIAL/PLATELET
Abs Immature Granulocytes: 0.04 10*3/uL (ref 0.00–0.07)
Basophils Absolute: 0 10*3/uL (ref 0.0–0.1)
Basophils Relative: 1 %
Eosinophils Absolute: 0.1 10*3/uL (ref 0.0–0.5)
Eosinophils Relative: 2 %
HCT: 36.7 % — ABNORMAL LOW (ref 39.0–52.0)
Hemoglobin: 11 g/dL — ABNORMAL LOW (ref 13.0–17.0)
Immature Granulocytes: 1 %
Lymphocytes Relative: 17 %
Lymphs Abs: 1.1 10*3/uL (ref 0.7–4.0)
MCH: 28.6 pg (ref 26.0–34.0)
MCHC: 30 g/dL (ref 30.0–36.0)
MCV: 95.3 fL (ref 80.0–100.0)
Monocytes Absolute: 0.3 10*3/uL (ref 0.1–1.0)
Monocytes Relative: 5 %
Neutro Abs: 4.8 10*3/uL (ref 1.7–7.7)
Neutrophils Relative %: 74 %
Platelets: 223 10*3/uL (ref 150–400)
RBC: 3.85 MIL/uL — ABNORMAL LOW (ref 4.22–5.81)
RDW: 14.2 % (ref 11.5–15.5)
WBC: 6.4 10*3/uL (ref 4.0–10.5)
nRBC: 0 % (ref 0.0–0.2)

## 2024-01-03 NOTE — ED Triage Notes (Signed)
Patient BIB EMS for evaluation after fall.  Patient had unwitnessed fall at facility this morning.  Per report, patient was found on floor when staff walked past room.  Unable downtime.  No reports of blood thinners.  Pt unable to recall events.  C/o pain to back and head.  Abrasion noted to back of head.  Staff reported that "he has been forgetting that he can use his legs."  Normally ambulates with a walker.

## 2024-01-03 NOTE — ED Notes (Signed)
[facility] called to transport patient back to facility at this time. Nurse notified.

## 2024-01-03 NOTE — ED Notes (Signed)
Pt repositioned and urinal placed between legs.

## 2024-01-03 NOTE — ED Notes (Addendum)
Attempted to call [facility]

## 2024-01-03 NOTE — ED Notes (Signed)
Pt requested urinal. Urinal provided and placed for pt.

## 2024-01-04 NOTE — ED Provider Notes (Signed)
 [place] EMERGENCY DEPARTMENT AT [facility] Provider Note   CSN: [id number] Arrival date & time: 01/03/24  0454     History  Chief Complaint  Patient presents with   [name] is a 68 y.o. male.  Patient presents from skilled nursing secondary to unwitnessed fall with abrasion to the back of his head.  Patient does not remember the fall with soundly he was found next to his bed.  He supposed to use a walker.  Planing some back pain, headache and neck pain.  No other complaints at this time.   Fall       Home Medications Prior to Admission medications   Medication Sig Start Date End Date Taking? Authorizing Provider  ARIPiprazole ER (ABILIFY MAINTENA) 400 MG SRER injection Inject 400 mg into the muscle every 3 (three) months.    [provider]  aspirin 81 MG chewable tablet Chew 81 mg by mouth daily.    [provider]  benzonatate (TESSALON) 100 MG capsule Take 1 capsule (100 mg total) by mouth every 8 (eight) hours. 12/28/23   Rondel Baton, MD  benztropine (COGENTIN) 1 MG tablet Take 1 mg by mouth 2 (two) times daily.     [provider]  cefdinir (OMNICEF) 300 MG capsule Take 1 capsule (300 mg total) by mouth 2 (two) times daily. 11/03/18   Leatha Gilding, MD  clonazePAM (KLONOPIN) 0.5 MG tablet Take 1 tablet (0.5 mg total) by mouth 2 (two) times daily. scheduled 11/03/18   Leatha Gilding, MD  docusate sodium (COLACE) 100 MG capsule Take 100 mg by mouth daily.    [provider]  DULoxetine (CYMBALTA) 30 MG capsule Take 90 mg by mouth daily.     [provider]  guaifenesin (ROBITUSSIN) 100 MG/5ML syrup Take 300 mg by mouth every 6 (six) hours as needed for cough.    [provider]  memantine (NAMENDA) 10 MG tablet Take 10 mg by mouth 2 (two) times daily.    [provider]  methocarbamol (ROBAXIN) 500 MG tablet Take 1 tablet (500 mg total) by mouth every 8 (eight) hours as  needed for muscle spasms. 07/11/18   Loren Racer, MD  nitroGLYCERIN (NITROSTAT) 0.4 MG SL tablet Place 0.4 mg under the tongue every 5 (five) minutes as needed for chest pain.    [provider]  OLANZapine (ZYPREXA) 7.5 MG tablet Take 7.5 mg by mouth at bedtime.    [provider]  Omega-3 Fatty Acids (FISH OIL) 1000 MG CAPS Take 1 capsule by mouth every evening.     [provider]  polyethylene glycol (MIRALAX / GLYCOLAX) packet Take 17 g by mouth daily as needed for moderate constipation. 07/11/18   Loren Racer, MD  Propylene Glycol (SYSTANE BALANCE) 0.6 % SOLN Place 1 drop into both eyes daily.    [provider]  simvastatin (ZOCOR) 20 MG tablet Take 20 mg by mouth at bedtime.    [provider]  traMADol (ULTRAM) 50 MG tablet Take 1 tablet (50 mg total) by mouth every 6 (six) hours as needed for moderate pain. 11/03/18   Leatha Gilding, MD      Allergies    Patient has no known allergies.    Review of Systems   Review of Systems  Physical Exam Updated Vital Signs BP (!) 112/53   Pulse 80   Temp 98.1 F (36.7 C) (Oral)   Resp 20  SpO2 96%  Physical Exam Vitals and nursing note reviewed.  Constitutional:      Appearance: He is well-developed.  HENT:     Head: Normocephalic.     Comments: Abrasion near occiput Cardiovascular:     Rate and Rhythm: Normal rate.  Pulmonary:     Effort: Pulmonary effort is normal. No respiratory distress.  Abdominal:     General: There is no distension.  Musculoskeletal:        General: Normal range of motion.     Cervical back: Normal range of motion.  Skin:    General: Skin is warm and dry.  Neurological:     Mental Status: He is alert. Mental status is at baseline.     ED Results / Procedures / Treatments   Labs (all labs ordered are listed, but only abnormal results are displayed) Labs Reviewed  CBC WITH DIFFERENTIAL/PLATELET - Abnormal; Notable for the following  components:      Result Value   RBC 3.85 (*)    Hemoglobin 11.0 (*)    HCT 36.7 (*)    All other components within normal limits  COMPREHENSIVE METABOLIC PANEL - Abnormal; Notable for the following components:   Glucose, Bld 129 (*)    BUN 24 (*)    Calcium 8.8 (*)    Total Protein 6.4 (*)    Albumin 3.0 (*)    All other components within normal limits  MAGNESIUM    EKG EKG Interpretation Date/Time:  Tuesday January 03 2024 05:30:36 EST Ventricular Rate:  90 PR Interval:  163 QRS Duration:  97 QT Interval:  365 QTC Calculation: 447 R Axis:   -67  Text Interpretation: Sinus rhythm LAD, consider left anterior fascicular block Low voltage, precordial leads Abnormal R-wave progression, late transition Confirmed by Marily Memos [phone]) on 01/03/2024 7:16:44 AM  Radiology CT Thoracic Spine Wo Contrast Result Date: 01/03/2024 CLINICAL DATA:  Unwitnessed fall at facility.  Back pain EXAM: CT THORACIC AND LUMBAR SPINE WITHOUT CONTRAST TECHNIQUE: Multidetector CT imaging of the thoracic and lumbar spine was performed without contrast. Multiplanar CT image reconstructions were also generated. RADIATION DOSE REDUCTION: This exam was performed according to the departmental dose-optimization program which includes automated exposure control, adjustment of the mA and/or kV according to patient size and/or use of iterative reconstruction technique. COMPARISON:  04/17/2018 abdominal CT. FINDINGS: CT THORACIC SPINE FINDINGS Alignment: Normal. Vertebrae: No acute fracture or focal pathologic process. Chronic T5 and T6 superior endplate fractures. Chronic moderate compression fracture at T11 with T10-11 intervertebral ankylosis. This fracture has occurred since a 2019 abdominal CT. Subjective generalized osteopenia. Paraspinal and other soft tissues: No perispinal hematoma or swelling detected. Disc levels: Generalized spondylitic spurring.  No bony impingement. CT LUMBAR SPINE FINDINGS Segmentation: 5  lumbar type vertebrae. Alignment: Normal. Vertebrae: No acute fracture or focal pathologic process. Paraspinal and other soft tissues: No perispinal hematoma or swelling detected Disc levels: Degenerative facet spurring especially at L4-5 and L5-S1 on the left. IMPRESSION: 1. No acute fracture or subluxation. 2. Chronic compression fractures most notably at T11. Electronically Signed   By: Tiburcio Pea M.D.   On: 01/03/2024 07:02   CT Lumbar Spine Wo Contrast Result Date: 01/03/2024 CLINICAL DATA:  Unwitnessed fall at facility.  Back pain EXAM: CT THORACIC AND LUMBAR SPINE WITHOUT CONTRAST TECHNIQUE: Multidetector CT imaging of the thoracic and lumbar spine was performed without contrast. Multiplanar CT image reconstructions were also generated. RADIATION DOSE REDUCTION: This exam was performed according to the departmental dose-optimization  program which includes automated exposure control, adjustment of the mA and/or kV according to patient size and/or use of iterative reconstruction technique. COMPARISON:  04/17/2018 abdominal CT. FINDINGS: CT THORACIC SPINE FINDINGS Alignment: Normal. Vertebrae: No acute fracture or focal pathologic process. Chronic T5 and T6 superior endplate fractures. Chronic moderate compression fracture at T11 with T10-11 intervertebral ankylosis. This fracture has occurred since a 2019 abdominal CT. Subjective generalized osteopenia. Paraspinal and other soft tissues: No perispinal hematoma or swelling detected. Disc levels: Generalized spondylitic spurring.  No bony impingement. CT LUMBAR SPINE FINDINGS Segmentation: 5 lumbar type vertebrae. Alignment: Normal. Vertebrae: No acute fracture or focal pathologic process. Paraspinal and other soft tissues: No perispinal hematoma or swelling detected Disc levels: Degenerative facet spurring especially at L4-5 and L5-S1 on the left. IMPRESSION: 1. No acute fracture or subluxation. 2. Chronic compression fractures most notably at T11.  Electronically Signed   By: Tiburcio Pea M.D.   On: 01/03/2024 07:02   CT Head Wo Contrast Result Date: 01/03/2024 CLINICAL DATA:  Unwitnessed fall at facility.  Head and neck trauma. EXAM: CT HEAD WITHOUT CONTRAST CT CERVICAL SPINE WITHOUT CONTRAST TECHNIQUE: Multidetector CT imaging of the head and cervical spine was performed following the standard protocol without intravenous contrast. Multiplanar CT image reconstructions of the cervical spine were also generated. RADIATION DOSE REDUCTION: This exam was performed according to the departmental dose-optimization program which includes automated exposure control, adjustment of the mA and/or kV according to patient size and/or use of iterative reconstruction technique. COMPARISON:  03/23/2015 FINDINGS: CT HEAD FINDINGS Brain: No evidence of acute infarction, hemorrhage, hydrocephalus, extra-axial collection or mass lesion/mass effect. Known pituitary mass with sellar expansion. Vascular: No hyperdense vessel or unexpected calcification. Skull: Normal. Negative for fracture or focal lesion. Sinuses/Orbits: Patchy paranasal sinus opacification greatest in the fronto ethmoid and right sphenoid sinuses, incidental to the history. CT CERVICAL SPINE FINDINGS Alignment: Straightening of cervical lordosis. Skull base and vertebrae: No acute fracture. No primary bone lesion or focal pathologic process. Soft tissues and spinal canal: No prevertebral fluid or swelling. No visible canal hematoma. Disc levels:  Ordinary disc space narrowing and endplate ridging. Upper chest: No evidence of injury IMPRESSION: 1. No evidence of acute intracranial or cervical spine injury. 2. Pituitary adenoma known since at least 2011. Electronically Signed   By: Tiburcio Pea M.D.   On: 01/03/2024 06:44   CT Cervical Spine Wo Contrast Result Date: 01/03/2024 CLINICAL DATA:  Unwitnessed fall at facility.  Head and neck trauma. EXAM: CT HEAD WITHOUT CONTRAST CT CERVICAL SPINE WITHOUT  CONTRAST TECHNIQUE: Multidetector CT imaging of the head and cervical spine was performed following the standard protocol without intravenous contrast. Multiplanar CT image reconstructions of the cervical spine were also generated. RADIATION DOSE REDUCTION: This exam was performed according to the departmental dose-optimization program which includes automated exposure control, adjustment of the mA and/or kV according to patient size and/or use of iterative reconstruction technique. COMPARISON:  03/23/2015 FINDINGS: CT HEAD FINDINGS Brain: No evidence of acute infarction, hemorrhage, hydrocephalus, extra-axial collection or mass lesion/mass effect. Known pituitary mass with sellar expansion. Vascular: No hyperdense vessel or unexpected calcification. Skull: Normal. Negative for fracture or focal lesion. Sinuses/Orbits: Patchy paranasal sinus opacification greatest in the fronto ethmoid and right sphenoid sinuses, incidental to the history. CT CERVICAL SPINE FINDINGS Alignment: Straightening of cervical lordosis. Skull base and vertebrae: No acute fracture. No primary bone lesion or focal pathologic process. Soft tissues and spinal canal: No prevertebral fluid or swelling. No visible canal  hematoma. Disc levels:  Ordinary disc space narrowing and endplate ridging. Upper chest: No evidence of injury IMPRESSION: 1. No evidence of acute intracranial or cervical spine injury. 2. Pituitary adenoma known since at least 2011. Electronically Signed   By: Tiburcio Pea M.D.   On: 01/03/2024 06:44   DG Chest Portable 1 View Result Date: 01/03/2024 CLINICAL DATA:  Fall EXAM: PORTABLE CHEST 1 VIEW COMPARISON:  12/26/2023 FINDINGS: Chronic cardiopericardial enlargement with vascular pedicle widening. Very low lung volumes. There is no edema, consolidation, effusion, or pneumothorax. No detected fracture, limited by soft tissue attenuation and poor bony detail. Artifact from EKG leads. IMPRESSION: Stable, limited low volume  chest. Electronically Signed   By: Tiburcio Pea M.D.   On: 01/03/2024 06:07    Procedures Procedures    Medications Ordered in ED Medications - No data to display  ED Course/ Medical Decision Making/ A&P                                 Medical Decision Making Amount and/or Complexity of Data Reviewed Labs: ordered. Radiology: ordered. ECG/medicine tests: ordered.   Seems to be at neurologic baseline. Labs ressuring. Imaging reassuring. Stable for d/c back to facility.    Final Clinical Impression(s) / ED Diagnoses Final diagnoses:  Fall, initial encounter  Abrasion  Visit for wound care    Rx / DC Orders ED Discharge Orders     None         Charmayne Odell, Barbara Cower, MD 01/04/24 [phone]

## 2024-01-14 ENCOUNTER — Emergency Department (HOSPITAL_COMMUNITY)
Admission: EM | Admit: 2024-01-14 | Discharge: 2024-01-14 | Disposition: A | Discharge status: Home / Self Care | Payer: Medicare Other | Attending: Emergency Medicine | Admitting: Emergency Medicine

## 2024-01-14 ENCOUNTER — Emergency Department (HOSPITAL_COMMUNITY): Payer: Medicare Other

## 2024-01-14 ENCOUNTER — Other Ambulatory Visit: Payer: Self-pay

## 2024-01-14 ENCOUNTER — Encounter (HOSPITAL_COMMUNITY): Payer: Self-pay | Admitting: Emergency Medicine

## 2024-01-14 DIAGNOSIS — I119 Hypertensive heart disease without heart failure: Secondary | ICD-10-CM | POA: Insufficient documentation

## 2024-01-14 DIAGNOSIS — S8002XA Contusion of left knee, initial encounter: Secondary | ICD-10-CM | POA: Diagnosis not present

## 2024-01-14 DIAGNOSIS — Z7982 Long term (current) use of aspirin: Secondary | ICD-10-CM | POA: Insufficient documentation

## 2024-01-14 DIAGNOSIS — R531 Weakness: Secondary | ICD-10-CM | POA: Insufficient documentation

## 2024-01-14 DIAGNOSIS — W1839XA Other fall on same level, initial encounter: Secondary | ICD-10-CM | POA: Insufficient documentation

## 2024-01-14 DIAGNOSIS — I700 Atherosclerosis of aorta: Secondary | ICD-10-CM | POA: Diagnosis not present

## 2024-01-14 DIAGNOSIS — W19XXXA Unspecified fall, initial encounter: Secondary | ICD-10-CM

## 2024-01-14 DIAGNOSIS — S8991XA Unspecified injury of right lower leg, initial encounter: Secondary | ICD-10-CM | POA: Diagnosis present

## 2024-01-14 DIAGNOSIS — R0902 Hypoxemia: Secondary | ICD-10-CM | POA: Diagnosis not present

## 2024-01-14 DIAGNOSIS — I10 Essential (primary) hypertension: Secondary | ICD-10-CM | POA: Insufficient documentation

## 2024-01-14 DIAGNOSIS — E119 Type 2 diabetes mellitus without complications: Secondary | ICD-10-CM | POA: Insufficient documentation

## 2024-01-14 DIAGNOSIS — F0390 Unspecified dementia without behavioral disturbance: Secondary | ICD-10-CM | POA: Diagnosis not present

## 2024-01-14 DIAGNOSIS — S8001XA Contusion of right knee, initial encounter: Secondary | ICD-10-CM | POA: Diagnosis not present

## 2024-01-14 DIAGNOSIS — S8000XA Contusion of unspecified knee, initial encounter: Secondary | ICD-10-CM

## 2024-01-14 DIAGNOSIS — I444 Left anterior fascicular block: Secondary | ICD-10-CM | POA: Diagnosis not present

## 2024-01-14 LAB — CBC WITH DIFFERENTIAL/PLATELET
Abs Immature Granulocytes: 0.03 10*3/uL (ref 0.00–0.07)
Basophils Absolute: 0 10*3/uL (ref 0.0–0.1)
Basophils Relative: 0 %
Eosinophils Absolute: 0.1 10*3/uL (ref 0.0–0.5)
Eosinophils Relative: 1 %
HCT: 38.3 % — ABNORMAL LOW (ref 39.0–52.0)
Hemoglobin: 11.9 g/dL — ABNORMAL LOW (ref 13.0–17.0)
Immature Granulocytes: 0 %
Lymphocytes Relative: 10 %
Lymphs Abs: 0.7 10*3/uL (ref 0.7–4.0)
MCH: 29.5 pg (ref 26.0–34.0)
MCHC: 31.1 g/dL (ref 30.0–36.0)
MCV: 95 fL (ref 80.0–100.0)
Monocytes Absolute: 0.5 10*3/uL (ref 0.1–1.0)
Monocytes Relative: 7 %
Neutro Abs: 5.7 10*3/uL (ref 1.7–7.7)
Neutrophils Relative %: 82 %
Platelets: 175 10*3/uL (ref 150–400)
RBC: 4.03 MIL/uL — ABNORMAL LOW (ref 4.22–5.81)
RDW: 14.7 % (ref 11.5–15.5)
WBC: 7 10*3/uL (ref 4.0–10.5)
nRBC: 0 % (ref 0.0–0.2)

## 2024-01-14 LAB — URINALYSIS, ROUTINE W REFLEX MICROSCOPIC
Bilirubin Urine: NEGATIVE
Glucose, UA: NEGATIVE mg/dL
Hgb urine dipstick: NEGATIVE
Ketones, ur: NEGATIVE mg/dL
Leukocytes,Ua: NEGATIVE
Nitrite: NEGATIVE
Protein, ur: NEGATIVE mg/dL
Specific Gravity, Urine: 1.028 (ref 1.005–1.030)
pH: 5 (ref 5.0–8.0)

## 2024-01-14 LAB — COMPREHENSIVE METABOLIC PANEL
ALT: 16 U/L (ref 0–44)
AST: 15 U/L (ref 15–41)
Albumin: 3.5 g/dL (ref 3.5–5.0)
Alkaline Phosphatase: 65 U/L (ref 38–126)
Anion gap: 11 (ref 5–15)
BUN: 23 mg/dL (ref 8–23)
CO2: 27 mmol/L (ref 22–32)
Calcium: 9.1 mg/dL (ref 8.9–10.3)
Chloride: 107 mmol/L (ref 98–111)
Creatinine, Ser: 0.95 mg/dL (ref 0.61–1.24)
GFR, Estimated: >60 mL/min (ref >60)
Glucose, Bld: 130 mg/dL — ABNORMAL HIGH (ref 70–99)
Potassium: 3.6 mmol/L (ref 3.5–5.1)
Sodium: 145 mmol/L (ref 135–145)
Total Bilirubin: 1.1 mg/dL (ref 0.0–1.2)
Total Protein: 6.8 g/dL (ref 6.5–8.1)

## 2024-01-14 LAB — BRAIN NATRIURETIC PEPTIDE: B Natriuretic Peptide: 23 pg/mL (ref 0.0–100.0)

## 2024-01-14 LAB — TROPONIN I (HIGH SENSITIVITY)
Troponin I (High Sensitivity): 2 ng/L (ref <18)
Troponin I (High Sensitivity): 4 ng/L (ref <18)

## 2024-01-14 LAB — LACTIC ACID, PLASMA
Lactic Acid, Venous: 1.5 mmol/L (ref 0.5–1.9)
Lactic Acid, Venous: 1.4 mmol/L (ref 0.5–1.9)

## 2024-01-14 MED ORDER — AZITHROMYCIN 250 MG PO TABS
500.0000 mg | ORAL_TABLET | Freq: Once | ORAL | Status: AC
  Administered 2024-01-14: 500 mg via ORAL
  Filled 2024-01-14: qty 2

## 2024-01-14 MED ORDER — HYDROCODONE-ACETAMINOPHEN 5-325 MG PO TABS
2.0000 | ORAL_TABLET | Freq: Once | ORAL | Status: AC
  Administered 2024-01-14: 2 via ORAL
  Filled 2024-01-14: qty 2

## 2024-01-14 MED ORDER — AZITHROMYCIN 250 MG PO TABS: 250.0000 mg | ORAL_TABLET | Freq: Every day | ORAL | 0 refills | Status: DC

## 2024-01-14 MED ORDER — SODIUM CHLORIDE 0.9 % IV BOLUS
1000.0000 mL | Freq: Once | INTRAVENOUS | Status: AC
  Administered 2024-01-14: 1000 mL via INTRAVENOUS

## 2024-01-14 NOTE — ED Provider Notes (Addendum)
 [place] EMERGENCY DEPARTMENT AT [facility] Provider Note   CSN: [id number] Arrival date & time: 01/14/24  1914     History  Chief Complaint  Patient presents with   Weakness    [name] is a 68 y.o. male.  Patient is a 68 year old male with past medical history of depression, psychosis, dementia, hypertension, diabetes, GERD, obesity.  Patient presenting today with complaints of weakness.  He was sent from his extended care facility after he became weak and had difficulty walking causing him to fall forward.  Patient complains of discomfort in both knees but has no other complaints.  I am told he was hypoxic with EMS and was placed on nasal cannula.  Patient offers minimal history secondary to dementia/baseline mental status.  The history is provided by the patient.       Home Medications Prior to Admission medications   Medication Sig Start Date End Date Taking? Authorizing Provider  ARIPiprazole ER (ABILIFY MAINTENA) 400 MG SRER injection Inject 400 mg into the muscle every 3 (three) months.    [provider]  aspirin 81 MG chewable tablet Chew 81 mg by mouth daily.    [provider]  benzonatate (TESSALON) 100 MG capsule Take 1 capsule (100 mg total) by mouth every 8 (eight) hours. 12/28/23   Rondel Baton, MD  benztropine (COGENTIN) 1 MG tablet Take 1 mg by mouth 2 (two) times daily.     [provider]  cefdinir (OMNICEF) 300 MG capsule Take 1 capsule (300 mg total) by mouth 2 (two) times daily. 11/03/18   Leatha Gilding, MD  clonazePAM (KLONOPIN) 0.5 MG tablet Take 1 tablet (0.5 mg total) by mouth 2 (two) times daily. scheduled 11/03/18   Leatha Gilding, MD  docusate sodium (COLACE) 100 MG capsule Take 100 mg by mouth daily.    [provider]  DULoxetine (CYMBALTA) 30 MG capsule Take 90 mg by mouth daily.     [provider]  guaifenesin (ROBITUSSIN) 100 MG/5ML syrup Take 300 mg by mouth every 6 (six)  hours as needed for cough.    [provider]  memantine (NAMENDA) 10 MG tablet Take 10 mg by mouth 2 (two) times daily.    [provider]  methocarbamol (ROBAXIN) 500 MG tablet Take 1 tablet (500 mg total) by mouth every 8 (eight) hours as needed for muscle spasms. 07/11/18   Loren Racer, MD  nitroGLYCERIN (NITROSTAT) 0.4 MG SL tablet Place 0.4 mg under the tongue every 5 (five) minutes as needed for chest pain.    [provider]  OLANZapine (ZYPREXA) 7.5 MG tablet Take 7.5 mg by mouth at bedtime.    [provider]  Omega-3 Fatty Acids (FISH OIL) 1000 MG CAPS Take 1 capsule by mouth every evening.     [provider]  polyethylene glycol (MIRALAX / GLYCOLAX) packet Take 17 g by mouth daily as needed for moderate constipation. 07/11/18   Loren Racer, MD  Propylene Glycol (SYSTANE BALANCE) 0.6 % SOLN Place 1 drop into both eyes daily.    [provider]  simvastatin (ZOCOR) 20 MG tablet Take 20 mg by mouth at bedtime.    [provider]  traMADol (ULTRAM) 50 MG tablet Take 1 tablet (50 mg total) by mouth every 6 (six) hours as needed for moderate pain. 11/03/18   Leatha Gilding, MD      Allergies    Patient has no known allergies.    Review  of Systems   Review of Systems  All other systems reviewed and are negative.   Physical Exam Updated Vital Signs BP 109/63   Pulse 95   Temp 97.8 F (36.6 C) (Oral)   Resp (!) 21   Ht 5\' 7"  (1.702 m)   Wt 127.6 kg   SpO2 93%   BMI 44.06 kg/m  Physical Exam Vitals and nursing note reviewed.  Constitutional:      General: He is not in acute distress.    Appearance: He is well-developed. He is not diaphoretic.  HENT:     Head: Normocephalic and atraumatic.  Cardiovascular:     Rate and Rhythm: Normal rate and regular rhythm.     Heart sounds: No murmur heard.    No friction rub.  Pulmonary:     Effort: Pulmonary effort is normal. No respiratory distress.      Breath sounds: Normal breath sounds. No wheezing or rales.  Abdominal:     General: Bowel sounds are normal. There is no distension.     Palpations: Abdomen is soft.     Tenderness: There is no abdominal tenderness.  Musculoskeletal:        General: Normal range of motion.     Cervical back: Normal range of motion and neck supple.     Comments: Bilateral knees are grossly normal in appearance.  There is no evidence for trauma or effusion.  He has pain with palpation and range of motion.  Skin:    General: Skin is warm and dry.  Neurological:     Mental Status: He is alert and oriented to person, place, and time.     Coordination: Coordination normal.     ED Results / Procedures / Treatments   Labs (all labs ordered are listed, but only abnormal results are displayed) Labs Reviewed  COMPREHENSIVE METABOLIC PANEL  CBC WITH DIFFERENTIAL/PLATELET  LACTIC ACID, PLASMA  LACTIC ACID, PLASMA  URINALYSIS, ROUTINE W REFLEX MICROSCOPIC  TROPONIN I (HIGH SENSITIVITY)    EKG EKG Interpretation Date/Time:  Saturday January 14 2024 03:27:10 EST Ventricular Rate:  99 PR Interval:  164 QRS Duration:  102 QT Interval:  372 QTC Calculation: 478 R Axis:   257  Text Interpretation: Sinus rhythm Left anterior fascicular block Low voltage, precordial leads Abnormal R-wave progression, late transition Borderline prolonged QT interval No significant change since 01/03/2024 Confirmed by Geoffery Lyons ([id number]) on 01/14/2024 3:32:17 AM  Radiology No results found.  Procedures Procedures    Medications Ordered in ED Medications  sodium chloride 0.9 % bolus 1,000 mL (has no administration in time range)    ED Course/ Medical Decision Making/ A&P  Patient is a 68 year old male sent from his extended care facility for weakness and a fall.  Patient arrives here with stable vital signs and is afebrile.  Physical examination reveals some tenderness overlying the knees, but is otherwise  unremarkable.  Laboratory studies obtained including CBC, CMP, troponin, BNP, and lactate.  All studies are basically unremarkable.  Urinalysis is clear.  Chest x-ray obtained showing possible streaky infiltrate and x-rays of the bilateral knees show no evidence for fracture.  Patient given hydrocodone for his knee pain.  I have found nothing to explain his weakness and nothing appears acute.  At this point, I feel as though patient can safely be discharged as I have found nothing emergent that would explain his weakness with the exception of a possible streaky infiltrate on the chest x-ray.  I will prescribe  Zithromax for this.  Patient to follow-up with primary doctor if not improving.  Final Clinical Impression(s) / ED Diagnoses Final diagnoses:  None    Rx / DC Orders ED Discharge Orders     None         Geoffery Lyons, MD 01/14/24 1610    Geoffery Lyons, MD 01/14/24 [phone]

## 2024-01-14 NOTE — ED Notes (Signed)
 Pt tried to give urine sample and was unsuccessful at this time.

## 2024-01-14 NOTE — Discharge Instructions (Addendum)
 Continue medications as previously prescribed.  Begin taking Zithromax as prescribed.  Follow-up with primary doctor if not improving in the next few days.

## 2024-01-14 NOTE — ED Triage Notes (Signed)
 Pt arrived via [facility] cc of weakness per the [facility] staff. EMS endorses that the [facility] staffed said the pt went down to his knees and then slid down to his butt. EMS endorses that the staff said his PCM is aware this is happening and told him to walk more but is not wanting to walk. EMS endorses VSS for them. Pt has Hx of dementia per EMS. EMS enodrses placing pt on 4L of [place] due to pts SpO2 at 88% upon arrival.

## 2024-05-04 ENCOUNTER — Emergency Department (HOSPITAL_COMMUNITY)

## 2024-05-04 ENCOUNTER — Other Ambulatory Visit: Payer: Self-pay

## 2024-05-04 ENCOUNTER — Emergency Department (HOSPITAL_COMMUNITY)
Admission: EM | Admit: 2024-05-04 | Discharge: 2024-05-09 | Disposition: A | Discharge status: Home / Self Care | Attending: Emergency Medicine | Admitting: Emergency Medicine

## 2024-05-04 ENCOUNTER — Encounter (HOSPITAL_COMMUNITY): Payer: Self-pay

## 2024-05-04 DIAGNOSIS — R262 Difficulty in walking, not elsewhere classified: Secondary | ICD-10-CM | POA: Diagnosis not present

## 2024-05-04 DIAGNOSIS — F0390 Unspecified dementia without behavioral disturbance: Secondary | ICD-10-CM | POA: Insufficient documentation

## 2024-05-04 DIAGNOSIS — I10 Essential (primary) hypertension: Secondary | ICD-10-CM | POA: Diagnosis not present

## 2024-05-04 DIAGNOSIS — Z79899 Other long term (current) drug therapy: Secondary | ICD-10-CM | POA: Diagnosis not present

## 2024-05-04 DIAGNOSIS — I444 Left anterior fascicular block: Secondary | ICD-10-CM | POA: Insufficient documentation

## 2024-05-04 DIAGNOSIS — E119 Type 2 diabetes mellitus without complications: Secondary | ICD-10-CM | POA: Diagnosis not present

## 2024-05-04 DIAGNOSIS — R531 Weakness: Secondary | ICD-10-CM | POA: Diagnosis present

## 2024-05-04 DIAGNOSIS — Z7982 Long term (current) use of aspirin: Secondary | ICD-10-CM | POA: Insufficient documentation

## 2024-05-04 HISTORY — DX: Unspecified dementia, unspecified severity, without behavioral disturbance, psychotic disturbance, mood disturbance, and anxiety: F03.90

## 2024-05-04 LAB — URINALYSIS, ROUTINE W REFLEX MICROSCOPIC
Bacteria, UA: NONE SEEN
Glucose, UA: NEGATIVE mg/dL
Hgb urine dipstick: NEGATIVE
Ketones, ur: 5 mg/dL — AB
Leukocytes,Ua: NEGATIVE
Nitrite: NEGATIVE
Protein, ur: 30 mg/dL — AB
Specific Gravity, Urine: 1.031 — ABNORMAL HIGH (ref 1.005–1.030)
pH: 5 (ref 5.0–8.0)

## 2024-05-04 LAB — COMPREHENSIVE METABOLIC PANEL WITH GFR
ALT: 17 U/L (ref 0–44)
AST: 17 U/L (ref 15–41)
Albumin: 3.7 g/dL (ref 3.5–5.0)
Alkaline Phosphatase: 65 U/L (ref 38–126)
Anion gap: 10 (ref 5–15)
BUN: 32 mg/dL — ABNORMAL HIGH (ref 8–23)
CO2: 27 mmol/L (ref 22–32)
Calcium: 9.4 mg/dL (ref 8.9–10.3)
Chloride: 103 mmol/L (ref 98–111)
Creatinine, Ser: 0.98 mg/dL (ref 0.61–1.24)
GFR, Estimated: >60 mL/min (ref >60)
Glucose, Bld: 149 mg/dL — ABNORMAL HIGH (ref 70–99)
Potassium: 3.7 mmol/L (ref 3.5–5.1)
Sodium: 140 mmol/L (ref 135–145)
Total Bilirubin: 1.1 mg/dL (ref 0.0–1.2)
Total Protein: 7.1 g/dL (ref 6.5–8.1)

## 2024-05-04 LAB — CBC
HCT: 41.9 % (ref 39.0–52.0)
Hemoglobin: 13.2 g/dL (ref 13.0–17.0)
MCH: 29.5 pg (ref 26.0–34.0)
MCHC: 31.5 g/dL (ref 30.0–36.0)
MCV: 93.7 fL (ref 80.0–100.0)
Platelets: 149 10*3/uL — ABNORMAL LOW (ref 150–400)
RBC: 4.47 MIL/uL (ref 4.22–5.81)
RDW: 14.9 % (ref 11.5–15.5)
WBC: 8.6 10*3/uL (ref 4.0–10.5)
nRBC: 0 % (ref 0.0–0.2)

## 2024-05-04 LAB — RESP PANEL BY RT-PCR (RSV, FLU A&B, COVID)  RVPGX2
Influenza A by PCR: NEGATIVE
Influenza B by PCR: NEGATIVE
Resp Syncytial Virus by PCR: NEGATIVE
SARS Coronavirus 2 by RT PCR: NEGATIVE

## 2024-05-04 LAB — LACTIC ACID, PLASMA: Lactic Acid, Venous: 1.4 mmol/L (ref 0.5–1.9)

## 2024-05-04 MED ORDER — LACTATED RINGERS IV BOLUS
1000.0000 mL | Freq: Once | INTRAVENOUS | Status: AC
Start: 2024-05-04 — End: 2024-05-05
  Administered 2024-05-04: 1000 mL via INTRAVENOUS

## 2024-05-04 NOTE — ED Triage Notes (Signed)
 Patient from [facility] for weakness that has been going on for a while. Nursing home staff reports the weakness has progressed due to patient becoming more sedentary. Tonight, patient was walking back from dinner with his walker, when he stopped and sat down due to the weakness. Patient has a history of dementia. Upon arrival to ER, patient is alert and oriented to self. Patient reports back and leg pain, 9/10

## 2024-05-04 NOTE — ED Notes (Signed)
 Update given to RN at [facility].

## 2024-05-04 NOTE — ED Notes (Signed)
 ED Provider at bedside.

## 2024-05-04 NOTE — ED Provider Notes (Signed)
 [place] EMERGENCY DEPARTMENT AT [facility] Provider Note   CSN: [id number] Arrival date & time: 05/04/24  9147     Patient presents with: Weakness   [name] is a 68 y.o. male.  He has has history of depression, psychosis, dementia, hypertension, diabetes, GERD, obesity.  He resides at [facility] nursing home.  He was brought in today with increased generalized weakness and having increased difficulty walking.  This is been going on for the past several days.  He is at decreased intake of food and liquids as well per ER.  Patient's nurse states he was satting 87% on room air, he is not on baseline home oxygen, placed on nasal cannula upon arrival to the ED.  Patient has dementia and has decreased mental status, not able to contribute to history.  {Add pertinent medical, surgical, social history, OB history to HPI:32947}  Weakness      Prior to Admission medications   Medication Sig Start Date End Date Taking? Authorizing Provider  ARIPiprazole ER (ABILIFY MAINTENA) 400 MG SRER injection Inject 400 mg into the muscle every 3 (three) months.    [provider]  aspirin  81 MG chewable tablet Chew 81 mg by mouth daily.    [provider]  azithromycin  (ZITHROMAX ) 250 MG tablet Take 1 tablet (250 mg total) by mouth daily. 01/14/24   Orvilla Blander, MD  benzonatate  (TESSALON ) 100 MG capsule Take 1 capsule (100 mg total) by mouth every 8 (eight) hours. 12/28/23   Ninetta Basket, MD  benztropine  (COGENTIN ) 1 MG tablet Take 1 mg by mouth 2 (two) times daily.     [provider]  cefdinir  (OMNICEF ) 300 MG capsule Take 1 capsule (300 mg total) by mouth 2 (two) times daily. 11/03/18   Gherghe, Costin M, MD  clonazePAM  (KLONOPIN ) 0.5 MG tablet Take 1 tablet (0.5 mg total) by mouth 2 (two) times daily. scheduled 11/03/18   Gherghe, Costin M, MD  docusate sodium  (COLACE) 100 MG capsule Take 100 mg by mouth daily.    [provider]  DULoxetine   (CYMBALTA ) 30 MG capsule Take 90 mg by mouth daily.     [provider]  guaifenesin  (ROBITUSSIN) 100 MG/5ML syrup Take 300 mg by mouth every 6 (six) hours as needed for cough.    [provider]  memantine  (NAMENDA ) 10 MG tablet Take 10 mg by mouth 2 (two) times daily.    [provider]  methocarbamol  (ROBAXIN ) 500 MG tablet Take 1 tablet (500 mg total) by mouth every 8 (eight) hours as needed for muscle spasms. 07/11/18   Evone Hoh, MD  nitroGLYCERIN (NITROSTAT) 0.4 MG SL tablet Place 0.4 mg under the tongue every 5 (five) minutes as needed for chest pain.    [provider]  OLANZapine  (ZYPREXA ) 7.5 MG tablet Take 7.5 mg by mouth at bedtime.    [provider]  Omega-3 Fatty Acids (FISH OIL) 1000 MG CAPS Take 1 capsule by mouth every evening.     [provider]  polyethylene glycol (MIRALAX  / GLYCOLAX ) packet Take 17 g by mouth daily as needed for moderate constipation. 07/11/18   Evone Hoh, MD  Propylene Glycol (SYSTANE BALANCE) 0.6 % SOLN Place 1 drop into both eyes daily.    [provider]  simvastatin  (ZOCOR ) 20 MG tablet Take 20 mg by mouth at bedtime.    [provider]  traMADol  (ULTRAM ) 50 MG tablet Take 1 tablet (50 mg total) by mouth every 6 (  six) hours as needed for moderate pain. 11/03/18   Gherghe, Costin M, MD    Allergies: Patient has no known allergies.    Review of Systems  Neurological:  Positive for weakness.    Updated Vital Signs BP 118/74   Pulse 93   Temp 99.1 F (37.3 C) (Oral)   Resp 17   Wt 123.6 kg   SpO2 96%   BMI 42.66 kg/m   Physical Exam  (all labs ordered are listed, but only abnormal results are displayed) Labs Reviewed  COMPREHENSIVE METABOLIC PANEL WITH GFR - Abnormal; Notable for the following components:      Result Value   Glucose, Bld 149 (*)    BUN 32 (*)    All other components within normal limits  CBC - Abnormal; Notable for the following  components:   Platelets 149 (*)    All other components within normal limits  URINALYSIS, ROUTINE W REFLEX MICROSCOPIC - Abnormal; Notable for the following components:   Color, Urine AMBER (*)    Specific Gravity, Urine 1.031 (*)    Bilirubin Urine SMALL (*)    Ketones, ur 5 (*)    Protein, ur 30 (*)    All other components within normal limits  RESP PANEL BY RT-PCR (RSV, FLU A&B, COVID)  RVPGX2  LACTIC ACID, PLASMA    EKG: None  Radiology: No results found.  {Document cardiac monitor, telemetry assessment procedure when appropriate:32947} Procedures   Medications Ordered in the ED - No data to display    {Click here for ABCD2, HEART and other calculators REFRESH Note before signing:1}                              Medical Decision Making Amount and/or Complexity of Data Reviewed Labs: ordered.   ***  {Document critical care time when appropriate  Document review of labs and clinical decision tools ie CHADS2VASC2, etc  Document your independent review of radiology images and any outside records  Document your discussion with family members, caretakers and with consultants  Document social determinants of health affecting pt's care  Document your decision making why or why not admission, treatments were needed:32947:::1}   Final diagnoses:  None    ED Discharge Orders     None

## 2024-05-04 NOTE — ED Provider Notes (Incomplete)
 [name] EMERGENCY DEPARTMENT AT [facility] Provider Note   CSN: [id number] Arrival date & time: 05/04/24  0865     Patient presents with: Weakness   [name] is a 68 y.o. male.  He has has history of depression, psychosis, dementia, hypertension, diabetes, GERD, obesity.  He resides at [facility] nursing home.  He was brought in today with increased generalized weakness and having increased difficulty walking.  This is been going on for the past several days.  He is at decreased intake of food and liquids as well per ER.  Patient's nurse states he was satting 87% on room air, he is not on baseline home oxygen, placed on nasal cannula upon arrival to the ED.  Patient has dementia and has decreased mental status, not able to contribute to history.  {Add pertinent medical, surgical, social history, OB history to HPI:32947}  Weakness      Prior to Admission medications   Medication Sig Start Date End Date Taking? Authorizing Provider  ARIPiprazole ER (ABILIFY MAINTENA) 400 MG SRER injection Inject 400 mg into the muscle every 3 (three) months.    [provider]  aspirin  81 MG chewable tablet Chew 81 mg by mouth daily.    [provider]  azithromycin  (ZITHROMAX ) 250 MG tablet Take 1 tablet (250 mg total) by mouth daily. 01/14/24   Orvilla Blander, MD  benzonatate  (TESSALON ) 100 MG capsule Take 1 capsule (100 mg total) by mouth every 8 (eight) hours. 12/28/23   Ninetta Basket, MD  benztropine  (COGENTIN ) 1 MG tablet Take 1 mg by mouth 2 (two) times daily.     [provider]  cefdinir  (OMNICEF ) 300 MG capsule Take 1 capsule (300 mg total) by mouth 2 (two) times daily. 11/03/18   Gherghe, Costin M, MD  clonazePAM  (KLONOPIN ) 0.5 MG tablet Take 1 tablet (0.5 mg total) by mouth 2 (two) times daily. scheduled 11/03/18   Gherghe, Costin M, MD  docusate sodium  (COLACE) 100 MG capsule Take 100 mg by mouth daily.    [provider]  DULoxetine   (CYMBALTA ) 30 MG capsule Take 90 mg by mouth daily.     [provider]  guaifenesin  (ROBITUSSIN) 100 MG/5ML syrup Take 300 mg by mouth every 6 (six) hours as needed for cough.    [provider]  memantine  (NAMENDA ) 10 MG tablet Take 10 mg by mouth 2 (two) times daily.    [provider]  methocarbamol  (ROBAXIN ) 500 MG tablet Take 1 tablet (500 mg total) by mouth every 8 (eight) hours as needed for muscle spasms. 07/11/18   Evone Hoh, MD  nitroGLYCERIN (NITROSTAT) 0.4 MG SL tablet Place 0.4 mg under the tongue every 5 (five) minutes as needed for chest pain.    [provider]  OLANZapine  (ZYPREXA ) 7.5 MG tablet Take 7.5 mg by mouth at bedtime.    [provider]  Omega-3 Fatty Acids (FISH OIL) 1000 MG CAPS Take 1 capsule by mouth every evening.     [provider]  polyethylene glycol (MIRALAX  / GLYCOLAX ) packet Take 17 g by mouth daily as needed for moderate constipation. 07/11/18   Evone Hoh, MD  Propylene Glycol (SYSTANE BALANCE) 0.6 % SOLN Place 1 drop into both eyes daily.    [provider]  simvastatin  (ZOCOR ) 20 MG tablet Take 20 mg by mouth at bedtime.    [provider]  traMADol  (ULTRAM ) 50 MG tablet Take 1 tablet (50 mg total) by mouth every 6 (  six) hours as needed for moderate pain. 11/03/18   Gherghe, Costin M, MD    Allergies: Patient has no known allergies.    Review of Systems  Neurological:  Positive for weakness.    Updated Vital Signs BP 118/74   Pulse 93   Temp 99.1 F (37.3 C) (Oral)   Resp 17   Wt 123.6 kg   SpO2 96%   BMI 42.66 kg/m   Physical Exam Vitals and nursing note reviewed.  Constitutional:      General: He is not in acute distress.    Appearance: He is well-developed.  HENT:     Head: Normocephalic and atraumatic.   Eyes:     Extraocular Movements: Extraocular movements intact.     Conjunctiva/sclera: Conjunctivae normal.     Pupils: Pupils are equal, round,  and reactive to light.    Cardiovascular:     Rate and Rhythm: Normal rate and regular rhythm.     Heart sounds: No murmur heard. Pulmonary:     Effort: Pulmonary effort is normal. No respiratory distress.     Breath sounds: Normal breath sounds.  Abdominal:     Palpations: Abdomen is soft.     Tenderness: There is no abdominal tenderness.   Musculoskeletal:        General: No swelling.     Cervical back: Neck supple.   Skin:    General: Skin is warm and dry.     Capillary Refill: Capillary refill takes less than 2 seconds.   Neurological:     Mental Status: He is alert and oriented to person, place, and time. Mental status is at baseline.   Psychiatric:        Mood and Affect: Mood normal.     (all labs ordered are listed, but only abnormal results are displayed) Labs Reviewed  COMPREHENSIVE METABOLIC PANEL WITH GFR - Abnormal; Notable for the following components:      Result Value   Glucose, Bld 149 (*)    BUN 32 (*)    All other components within normal limits  CBC - Abnormal; Notable for the following components:   Platelets 149 (*)    All other components within normal limits  URINALYSIS, ROUTINE W REFLEX MICROSCOPIC - Abnormal; Notable for the following components:   Color, Urine AMBER (*)    Specific Gravity, Urine 1.031 (*)    Bilirubin Urine SMALL (*)    Ketones, ur 5 (*)    Protein, ur 30 (*)    All other components within normal limits  RESP PANEL BY RT-PCR (RSV, FLU A&B, COVID)  RVPGX2  LACTIC ACID, PLASMA    EKG: None  Radiology: No results found.  {Document cardiac monitor, telemetry assessment procedure when appropriate:32947} Procedures   Medications Ordered in the ED - No data to display    {Click here for ABCD2, HEART and other calculators REFRESH Note before signing:1}                              Medical Decision Making Amount and/or Complexity of Data Reviewed Labs: ordered. Radiology: ordered.   ***  {Document critical  care time when appropriate  Document review of labs and clinical decision tools ie CHADS2VASC2, etc  Document your independent review of radiology images and any outside records  Document your discussion with family members, caretakers and with consultants  Document social determinants of health affecting pt's care  Document your decision making  why or why not admission, treatments were needed:32947:::1}   Final diagnoses:  None    ED Discharge Orders     None

## 2024-05-05 ENCOUNTER — Emergency Department (HOSPITAL_COMMUNITY)

## 2024-05-05 DIAGNOSIS — R531 Weakness: Secondary | ICD-10-CM | POA: Diagnosis not present

## 2024-05-05 LAB — BLOOD GAS, ARTERIAL
Acid-Base Excess: 3.7 mmol/L — ABNORMAL HIGH (ref 0.0–2.0)
Bicarbonate: 28.5 mmol/L — ABNORMAL HIGH (ref 20.0–28.0)
Drawn by: 22223
O2 Saturation: 100 %
Patient temperature: 36.3
pCO2 arterial: 42 mmHg (ref 32–48)
pH, Arterial: 7.44 (ref 7.35–7.45)
pO2, Arterial: 119 mmHg — ABNORMAL HIGH (ref 83–108)

## 2024-05-05 MED ORDER — TRAMADOL HCL 50 MG PO TABS
50.0000 mg | ORAL_TABLET | Freq: Once | ORAL | Status: AC
  Administered 2024-05-05: 50 mg via ORAL
  Filled 2024-05-05: qty 1

## 2024-05-05 MED ORDER — IOHEXOL 350 MG/ML SOLN
75.0000 mL | Freq: Once | INTRAVENOUS | Status: AC | PRN
  Administered 2024-05-05: 75 mL via INTRAVENOUS

## 2024-05-05 NOTE — Evaluation (Signed)
 Physical Therapy Evaluation Patient Details Name: [name] MRN: [id number] DOB: October 18, 1956 Today's Date: 05/05/2024  History of Present Illness  Patient from [facility] for weakness that has been going on for a while. Nursing home staff reports the weakness has progressed due to patient becoming more sedentary. Tonight, patient was walking back from dinner with his walker, when he stopped and sat down due to the weakness. Patient has a history of dementia. Upon arrival to ER, patient is alert and oriented to self. Patient reports back and leg pain, 9/10    Clinical Impression  On PT arrival, patient is in bed sitting with head up and alert.  He is pleasant and agreeable to therapist assessment.  He reports 9/10 back and leg pain at rest.   Patient needs max encouragement for supine to sit with PT moderate assist to bring trunk fully upright and come fully to the edge of the bed.  He is able to sit on edge of bed with min A with feet and hands supporting his trunk.  Sit to stand to RW with max encouragement to attempt with mod A from therapist.  He needs cues to come fully upright as he tends to stand with trunk flexed.  Patient is able to march x 2 each leg with min A with RW but complains of increasing pain in his back and legs and needs to sit back down.  Moderate assist for sit to supine with legs.  He is able to scoot up in bed and roll with min A.  Patient left in bed with call button in reach and nursing notified of mobility status. Patient will benefit from continued skilled therapy services during the remainder of her\\is hospital stay and at the next recommended venue of care to address deficits and promote return to optimal function.            If plan is discharge home, recommend the following: A lot of help with walking and/or transfers;A lot of help with bathing/dressing/bathroom;Supervision due to cognitive status;Assistance with cooking/housework   Can travel by private vehicle    No    Equipment Recommendations None recommended by PT  Recommendations for Other Services       Functional Status Assessment Patient has had a recent decline in their functional status and demonstrates the ability to make significant improvements in function in a reasonable and predictable amount of time.     Precautions / Restrictions Precautions Precautions: Fall Recall of Precautions/Restrictions: Intact Restrictions Weight Bearing Restrictions Per Provider Order: No      Mobility  Bed Mobility Overal bed mobility: Needs Assistance Bed Mobility: Supine to Sit, Sit to Supine     Supine to sit: Mod assist Sit to supine: Mod assist   General bed mobility comments: able to scoot and bridge wiht min assist    Transfers Overall transfer level: Needs assistance Equipment used: Rolling walker (2 wheels) Transfers: Sit to/from Stand Sit to Stand: Mod assist           General transfer comment: slow, painful (unable to walk but could march in place a couple of steps with RW)    Ambulation/Gait Ambulation/Gait assistance: Mod assist (march in place x 2 each leg) Gait Distance (Feet): 0 Feet Assistive device: Rolling walker (2 wheels)            Stairs            Wheelchair Mobility     Tilt Bed    Modified Rankin (  Stroke Patients Only)       Balance Overall balance assessment: Needs assistance Sitting-balance support: Feet supported Sitting balance-Leahy Scale: Fair Sitting balance - Comments: on edge of bed with min A for balance   Standing balance support: Bilateral upper extremity supported, During functional activity, Reliant on assistive device for balance (RW and min to mod A for balance) Standing balance-Leahy Scale: Fair Standing balance comment: with RW                             Pertinent Vitals/Pain Pain Assessment Pain Assessment: 0-10 Pain Score: 9  Pain Location: legs and back Pain Descriptors / Indicators:  Grimacing, Guarding Pain Intervention(s): Monitored during session, Limited activity within patient's tolerance    Home Living Family/patient expects to be discharged to:: Assisted living                 Home Equipment: Agricultural consultant (2 wheels) Additional Comments: patient a resident at [facility]    Prior Function Prior Level of Function : Needs assist;History of Falls (last six months);Patient poor historian/Family not available;Other (comment)  Cognitive Assist :  (history of dementia)           Mobility Comments:  (apparently walks with RW at baseline per patient report)       Extremity/Trunk Assessment   Upper Extremity Assessment Upper Extremity Assessment: Generalized weakness    Lower Extremity Assessment Lower Extremity Assessment: Generalized weakness;RLE deficits/detail;LLE deficits/detail RLE Deficits / Details: painful LLE Deficits / Details: painful LLE: Unable to fully assess due to pain    Cervical / Trunk Assessment Cervical / Trunk Assessment: Kyphotic  Communication   Communication Communication: No apparent difficulties    Cognition Arousal: Alert Behavior During Therapy: WFL for tasks assessed/performed   PT - Cognitive impairments: History of cognitive impairments                       PT - Cognition Comments: history of dementia Following commands: Intact       Cueing Cueing Techniques: Verbal cues, Gestural cues, Tactile cues     General Comments General comments (skin integrity, edema, etc.): reports back and leg pain throughout assessment    Exercises     Assessment/Plan    PT Assessment    PT Problem List         PT Treatment Interventions      PT Goals (Current goals can be found in the Care Plan section)  Acute Rehab PT Goals Patient Stated Goal: return to assisted living after rehab PT Goal Formulation: With patient Time For Goal Achievement: 05/19/24 Potential to Achieve Goals: Good     Frequency       Co-evaluation               AM-PAC PT 6 Clicks Mobility  Outcome Measure Help needed turning from your back to your side while in a flat bed without using bedrails?: A Lot Help needed moving from lying on your back to sitting on the side of a flat bed without using bedrails?: A Lot Help needed moving to and from a bed to a chair (including a wheelchair)?: A Lot Help needed standing up from a chair using your arms (e.g., wheelchair or bedside chair)?: A Lot Help needed to walk in hospital room?: A Lot Help needed climbing 3-5 steps with a railing? : Total 6 Click Score: 11    End of Session  Equipment Utilized During Treatment: Gait belt Activity Tolerance: Patient limited by pain Patient left: in bed;with call bell/phone within reach Nurse Communication: Mobility status PT Visit Diagnosis: Unsteadiness on feet (R26.81);Other abnormalities of gait and mobility (R26.89);Muscle weakness (generalized) (M62.81);Difficulty in walking, not elsewhere classified (R26.2);Pain Pain - Right/Left: Left Pain - part of body: Leg (both right and left)    Time: 1478-2956 PT Time Calculation (min) (ACUTE ONLY): 25 min   Charges:   PT Evaluation $PT Eval Moderate Complexity: 1 Mod   PT General Charges $$ ACUTE PT VISIT: 1 Visit         10:22 AM, 05/05/24 Mone Commisso Small Regan Mcbryar MPT [place] [facility] [place] [phone] Ph:[phone]

## 2024-05-05 NOTE — ED Notes (Signed)
 PT in room to evaluate.

## 2024-05-05 NOTE — TOC Initial Note (Signed)
 Transition of Care [facility]) - Initial/Assessment Note    Patient Details  Name: [name] MRN: [id number] Date of Birth: January 31, 1956  Transition of Care [facility]) CM/SW Contact:    Orelia Binet, RN Phone Number: 05/05/2024, 1:16 PM  Clinical Narrative:    Patient in ED continuing workup for weakness. Patient is from [facility]. PT is recommending SNF. Patient did not walk on assessment. TOC unable to reach Legal Guardian or [name] at [facility]. They will not take him back not ambulating brought here for assessment and eval for SNF. PASRR is pending, TOC completed FL2 for bed offers. Admission pending. Patient has medicare, and Not active with [facility]. TOC following.              Expected Discharge Plan: Skilled Nursing Facility Barriers to Discharge: Continued Medical Work up, SNF Pending bed offer   Patient Goals and CMS Choice Patient states their goals for this hospitalization and ongoing recovery are:: to get stronger CMS Medicare.gov Compare Post Acute Care list provided to:: Patient       Expected Discharge Plan and Services       Living arrangements for the past 2 months: Assisted Living Facility                       Prior Living Arrangements/Services Living arrangements for the past 2 months: Assisted Living Facility Lives with:: Facility Resident Patient language and need for interpreter reviewed:: Yes        Need for Family Participation in Patient Care: Yes (Comment) Care giver support system in place?: Yes (comment)   Criminal Activity/Legal Involvement Pertinent to Current Situation/Hospitalization: No - Comment as needed  Activities of Daily Living      Permission Sought/Granted      Share Information with NAME: Legal Guardian           Emotional Assessment     Affect (typically observed): Accepting Orientation: : Oriented to Self, Oriented to Situation, Oriented to Place Alcohol  / Substance Use: Not Applicable Psych Involvement: No  (comment)  Admission diagnosis:  weakness Patient Active Problem List   Diagnosis Date Noted   Acute respiratory failure with hypoxia (HCC) 11/01/2018   HCAP (healthcare-associated pneumonia) 11/01/2018   HTN (hypertension) 09/10/2014   Diabetes mellitus (HCC) 09/10/2014   Chronic cholecystitis with calculus 09/09/2014   Depression, major, recurrent, 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

## 2024-05-05 NOTE — ED Notes (Signed)
 Patient transported to CT

## 2024-05-05 NOTE — NC FL2 (Cosign Needed Addendum)
 [place]  MEDICAID FL2 LEVEL OF CARE FORM     IDENTIFICATION  Patient Name: [name] Birthdate: 03-04-1956 Sex: male Admission Date (Current Location): 05/04/2024  [facility] and [place] Number:  [employer] and Address:  [facility],  [address], [place] [address]      Provider Number: [id number]  Attending Physician Name and Address:  Rory Collard, MD  Relative Name and Phone Number:  Felicitas Horse (Legal Guardian)  [phone]    Current Level of Care: Hospital Recommended Level of Care: Skilled Nursing Facility Prior Approval Number:    Date Approved/Denied:   PASRR Number: Pending  Discharge Plan: SNF    Current Diagnoses: Patient Active Problem List   Diagnosis Date Noted   Acute respiratory failure with hypoxia (HCC) 11/01/2018   HCAP (healthcare-associated pneumonia) 11/01/2018   HTN (hypertension) 09/10/2014   Diabetes mellitus (HCC) 09/10/2014   Chronic cholecystitis with calculus 09/09/2014   Depression, major, recurrent, 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   Symptomatic cholelithiasis 09/21/2012    Orientation RESPIRATION BLADDER Height & Weight     Self, Place  Normal Continent Weight: 123.6 kg Height:     BEHAVIORAL SYMPTOMS/MOOD NEUROLOGICAL BOWEL NUTRITION STATUS      Continent  (See DC summary)  AMBULATORY STATUS COMMUNICATION OF NEEDS Skin   Extensive Assist Verbally Normal                       Personal Care Assistance Level of Assistance  Bathing, Feeding, Dressing Bathing Assistance: Maximum assistance Feeding assistance: Limited assistance       Functional Limitations Info  Sight, Hearing, Speech Sight Info: Impaired Hearing Info: Adequate Speech Info: Impaired    SPECIAL CARE FACTORS FREQUENCY  PT (By licensed PT)     PT Frequency: 5 Times a week              Contractures Contractures Info: Not present    Additional Factors Info  Allergies, Code Status Code Status Info:  DNR Allergies Info: NKDA           Current Medications (05/05/2024):  This is the current hospital active medication list No current facility-administered medications for this encounter.   Current Outpatient Medications  Medication Sig Dispense Refill   Levothyroxine Sodium 25 MCG CAPS Take 1 capsule by mouth daily.     ABILIFY MAINTENA 400 MG PRSY prefilled syringe Inject 400 mg into the muscle.     aspirin  81 MG chewable tablet Chew 81 mg by mouth daily.     azithromycin  (ZITHROMAX ) 250 MG tablet Take 1 tablet (250 mg total) by mouth daily. 4 tablet 0   benzonatate  (TESSALON ) 100 MG capsule Take 1 capsule (100 mg total) by mouth every 8 (eight) hours. 21 capsule 0   benztropine  (COGENTIN ) 1 MG tablet Take 1 mg by mouth 2 (two) times daily.      cefdinir  (OMNICEF ) 300 MG capsule Take 1 capsule (300 mg total) by mouth 2 (two) times daily. 10 capsule 0   clonazePAM  (KLONOPIN ) 0.5 MG tablet Take 1 tablet (0.5 mg total) by mouth 2 (two) times daily. scheduled 10 tablet 0   docusate sodium  (COLACE) 100 MG capsule Take 100 mg by mouth daily.     DULoxetine  (CYMBALTA ) 30 MG capsule Take 90 mg by mouth daily.      gabapentin (NEURONTIN) 100 MG capsule Take 100 mg by mouth 2 (two) times daily.     guaifenesin  (ROBITUSSIN) 100 MG/5ML syrup Take 300  mg by mouth every 6 (six) hours as needed for cough.     memantine  (NAMENDA ) 10 MG tablet Take 10 mg by mouth 2 (two) times daily.     metFORMIN (GLUCOPHAGE) 500 MG tablet Take 500 mg by mouth daily.     methocarbamol  (ROBAXIN ) 500 MG tablet Take 1 tablet (500 mg total) by mouth every 8 (eight) hours as needed for muscle spasms. 30 tablet 0   nitroGLYCERIN (NITROSTAT) 0.4 MG SL tablet Place 0.4 mg under the tongue every 5 (five) minutes as needed for chest pain.     nystatin (MYCOSTATIN/NYSTOP) powder Apply 1 Application topically 2 (two) times daily.     OLANZapine  (ZYPREXA ) 20 MG tablet Take 20 mg by mouth at bedtime.     Omega-3 Fatty Acids (FISH  OIL) 1000 MG CAPS Take 1 capsule by mouth every evening.      polyethylene glycol (MIRALAX  / GLYCOLAX ) packet Take 17 g by mouth daily as needed for moderate constipation. 14 each 0   pravastatin (PRAVACHOL) 40 MG tablet Take 40 mg by mouth daily.     Propylene Glycol (SYSTANE BALANCE) 0.6 % SOLN Place 1 drop into both eyes daily.     simvastatin  (ZOCOR ) 20 MG tablet Take 20 mg by mouth at bedtime.     traMADol  (ULTRAM ) 50 MG tablet Take 1 tablet (50 mg total) by mouth every 6 (six) hours as needed for moderate pain. 10 tablet 0     Discharge Medications: Please see discharge summary for a list of discharge medications.  Relevant Imaging Results:  Relevant Lab Results:   Additional Information SS# [id number]  Orelia Binet, RN

## 2024-05-05 NOTE — ED Notes (Signed)
 Attempted to ambulate patient with walker and x2 assist. Pt got to standing position and stated My legs hurt, I can't do it. Pt unable to take any steps. Pt c/o mild nausea and dizziness upon sitting. Dr Carylon Claude at bedside and aware. Pt assisted back to bed and urinal placed for pt to void.

## 2024-05-05 NOTE — ED Notes (Signed)
 Nurse called DSS [place] and pt is not on their Ward List, per [facility] .

## 2024-05-05 NOTE — ED Notes (Signed)
 Nurse contacted facility and report was given that pt can feed himself, take his pills whole, is incontinent majority of the time, and was walking with walker to dining area independently but slowly. Report stated pt did not have a guardian but MAR states pt has a DSS guardian.

## 2024-05-06 DIAGNOSIS — R531 Weakness: Secondary | ICD-10-CM | POA: Diagnosis not present

## 2024-05-06 MED ORDER — LEVOTHYROXINE SODIUM 50 MCG PO TABS
25.0000 ug | ORAL_TABLET | Freq: Every day | ORAL | Status: DC
  Administered 2024-05-06 – 2024-05-09 (×4): 25 ug via ORAL
  Filled 2024-05-06 (×4): qty 1

## 2024-05-06 MED ORDER — ACETAMINOPHEN 325 MG PO TABS
650.0000 mg | ORAL_TABLET | Freq: Three times a day (TID) | ORAL | Status: DC | PRN
  Administered 2024-05-06 – 2024-05-07 (×2): 650 mg via ORAL
  Filled 2024-05-06 (×2): qty 2

## 2024-05-06 MED ORDER — DOCUSATE SODIUM 100 MG PO CAPS
100.0000 mg | ORAL_CAPSULE | Freq: Every day | ORAL | Status: DC
  Administered 2024-05-06 – 2024-05-09 (×4): 100 mg via ORAL
  Filled 2024-05-06 (×4): qty 1

## 2024-05-06 MED ORDER — ARIPIPRAZOLE ER 400 MG IM PRSY: 400.0000 mg | PREFILLED_SYRINGE | INTRAMUSCULAR | Status: DC

## 2024-05-06 MED ORDER — ASPIRIN 81 MG PO CHEW
81.0000 mg | CHEWABLE_TABLET | Freq: Every day | ORAL | Status: DC
  Administered 2024-05-06 – 2024-05-09 (×4): 81 mg via ORAL
  Filled 2024-05-06 (×4): qty 1

## 2024-05-06 MED ORDER — GABAPENTIN 100 MG PO CAPS
100.0000 mg | ORAL_CAPSULE | Freq: Two times a day (BID) | ORAL | Status: DC
  Administered 2024-05-06 – 2024-05-09 (×7): 100 mg via ORAL
  Filled 2024-05-06 (×7): qty 1

## 2024-05-06 MED ORDER — CLONAZEPAM 0.5 MG PO TABS
0.5000 mg | ORAL_TABLET | Freq: Two times a day (BID) | ORAL | Status: DC
  Administered 2024-05-06 – 2024-05-09 (×7): 0.5 mg via ORAL
  Filled 2024-05-06 (×7): qty 1

## 2024-05-06 MED ORDER — OLANZAPINE 5 MG PO TABS
20.0000 mg | ORAL_TABLET | Freq: Every day | ORAL | Status: DC
  Administered 2024-05-06 – 2024-05-08 (×3): 20 mg via ORAL
  Filled 2024-05-06 (×3): qty 4

## 2024-05-06 MED ORDER — MEMANTINE HCL 10 MG PO TABS
10.0000 mg | ORAL_TABLET | Freq: Two times a day (BID) | ORAL | Status: DC
  Administered 2024-05-06 – 2024-05-09 (×7): 10 mg via ORAL
  Filled 2024-05-06 (×7): qty 1

## 2024-05-06 MED ORDER — BENZTROPINE MESYLATE 1 MG PO TABS
1.0000 mg | ORAL_TABLET | Freq: Two times a day (BID) | ORAL | Status: DC
  Administered 2024-05-06 – 2024-05-09 (×7): 1 mg via ORAL
  Filled 2024-05-06 (×7): qty 1

## 2024-05-06 MED ORDER — ACETAMINOPHEN 160 MG/5ML PO SOLN: 640.0000 mg | Freq: Once | ORAL | Status: DC

## 2024-05-06 MED ORDER — METFORMIN HCL 500 MG PO TABS
500.0000 mg | ORAL_TABLET | Freq: Every day | ORAL | Status: DC
  Administered 2024-05-06 – 2024-05-09 (×4): 500 mg via ORAL
  Filled 2024-05-06 (×4): qty 1

## 2024-05-06 MED ORDER — TRAMADOL HCL 50 MG PO TABS
50.0000 mg | ORAL_TABLET | Freq: Four times a day (QID) | ORAL | Status: DC | PRN
  Administered 2024-05-07: 50 mg via ORAL
  Filled 2024-05-06: qty 1

## 2024-05-06 MED ORDER — DULOXETINE HCL 30 MG PO CPEP
30.0000 mg | ORAL_CAPSULE | Freq: Every day | ORAL | Status: DC
  Administered 2024-05-06 – 2024-05-09 (×4): 30 mg via ORAL
  Filled 2024-05-06 (×4): qty 1

## 2024-05-06 NOTE — ED Notes (Signed)
 Pt cleansed from incontinent episode, clean sheets, brief, and condom cath placed. Pt tolerated well. Pt repositioned in bed. Call bell within reach.

## 2024-05-06 NOTE — ED Provider Notes (Signed)
 Emergency Medicine Observation Re-evaluation Note  [name] is a 68 y.o. male, seen on rounds today.  Pt initially presented to the ED for complaints of Weakness Currently, the patient is resting comfortably complaining of some mild chronic pain.  Physical Exam  BP 116/60 (BP Location: Left Arm)   Pulse 67   Temp 97.9 F (36.6 C) (Oral)   Resp 19   Wt 123.6 kg   SpO2 95%   BMI 42.66 kg/m  Physical Exam General: No distress Cardiac: No respiratory increase in effort, no hypoxia, no tachycardia Lungs: Clear without increased work of breathing Psych: Calm and redirectable  ED Course / MDM  EKG:EKG Interpretation Date/Time:  Friday May 04 2024 20:17:39 EDT Ventricular Rate:  90 PR Interval:  160 QRS Duration:  99 QT Interval:  379 QTC Calculation: 464 R Axis:   -81  Text Interpretation: Sinus rhythm Left anterior fascicular block Low voltage, precordial leads Consider anterior infarct Confirmed by Donita Furrow ([id number]) on 05/05/2024 12:12:18 AM  I have reviewed the labs performed to date as well as medications administered while in observation.  Recent changes in the last 24 hours include pending placement.  Plan  Current plan is for transition of care team is seen the patient and is working on placement, no beds at this time.    Early Glisson, MD 05/06/24 [phone]

## 2024-05-06 NOTE — ED Notes (Signed)
 CSW notes at this time SNF bed offers pending at this time. TOC continues to follow.

## 2024-05-06 NOTE — ED Notes (Signed)
Pt provided with coke per request.

## 2024-05-07 DIAGNOSIS — R531 Weakness: Secondary | ICD-10-CM | POA: Diagnosis not present

## 2024-05-07 MED ORDER — PRAVASTATIN SODIUM 40 MG PO TABS
40.0000 mg | ORAL_TABLET | Freq: Every day | ORAL | Status: DC
  Administered 2024-05-08 – 2024-05-09 (×2): 40 mg via ORAL
  Filled 2024-05-07 (×2): qty 1

## 2024-05-07 NOTE — ED Notes (Addendum)
 Patient will not be able to go to SNF due to not having a 3-night stay . Writer has made numerous attempts to contact LG but no response , line just says busy. Writer did reach out to [facility] and explained to them that patient has not been admitted nor had a 3-night stay. TOC to follow.    Addendum 3:51 pm   Writer attempted again to contact LG and [name]. No response and unable to leave a confidential voicemail. TOC to follow. Writer spoke with facility to get in touch with [name] . [name] from [facility] shared that [name] was not in today and to call back tomorrow. Writer asked [name] if they had an alternative number for [employer] and she declined stating that they did not have a new number for her and that she is not patient LG, just his niece. The other niece [name] number is out of order.

## 2024-05-07 NOTE — ED Provider Notes (Signed)
 Emergency Medicine Observation Re-evaluation Note  Javen Hinderliter is a 68 y.o. male, seen on rounds today.  Pt initially presented to the ED for complaints of Weakness Currently, the patient is resting.  Physical Exam  BP 100/63   Pulse 73   Temp 98.6 F (37 C) (Oral)   Resp 17   Wt 123.6 kg   SpO2 92%   BMI 42.66 kg/m  Physical Exam General: NAD   ED Course / MDM  EKG:EKG Interpretation Date/Time:  Friday May 04 2024 20:17:39 EDT Ventricular Rate:  90 PR Interval:  160 QRS Duration:  99 QT Interval:  379 QTC Calculation: 464 R Axis:   -81  Text Interpretation: Sinus rhythm Left anterior fascicular block Low voltage, precordial leads Consider anterior infarct Confirmed by Donita Furrow ([id number]) on 05/05/2024 12:12:18 AM  I have reviewed the labs performed to date as well as medications administered while in observation.  Recent changes in the last 24 hours include none.  Plan  Current plan is for placement.    Rosealee Concha, MD 05/07/24 [phone]

## 2024-05-08 DIAGNOSIS — R531 Weakness: Secondary | ICD-10-CM | POA: Diagnosis not present

## 2024-05-08 NOTE — ED Provider Notes (Signed)
 DOB: [birth year]   Date: 05/08/2024   MUST ID: [id number]   To Whom It May Concern:   Please be advised that the above name patient will require a short-term nursing home stay-anticipated 30 days or less rehabilitation and strengthening. The plan is for return home.    Mordecai Applebaum, MD 05/08/24 1537

## 2024-05-08 NOTE — ED Notes (Addendum)
 Writer attempted to contact Ms. [name], mailbox still full. Writer is still unable to speak with patient nieces. Furthermore, [name], with [name] reached out stating that they can offer patient a bed under his Medicaid , but unable to reach anyone to agree on bed options for placement. No address on file to do a wellness check. TOC to follow.   Addendum 2:49 pm   Writer called [facility] back and spoke with [name]. [name] shared that [name] is patient Careers information officer and that patient makes his own decisions. When Clinical research associate spoke with patient at bedside, patient was able to answer certain questions that were clear such as where he resigned prior to coming to the hospital and his niece name. TOC will continue to follow.

## 2024-05-08 NOTE — ED Provider Notes (Signed)
 Emergency Medicine Observation Re-evaluation Note  [name] is a 68 y.o. male, seen on rounds today.  Pt initially presented to the ED for complaints of Weakness Currently, the patient is resting comfortably.  Physical Exam  BP (!) 138/95   Pulse 82   Temp 98.4 F (36.9 C) (Axillary)   Resp 20   Wt 123.6 kg   SpO2 92%   BMI 42.66 kg/m  Physical Exam Vitals and nursing note reviewed.  Constitutional:      General: He is not in acute distress.    Appearance: He is well-developed.  HENT:     Head: Normocephalic and atraumatic.   Eyes:     Conjunctiva/sclera: Conjunctivae normal.    Cardiovascular:     Rate and Rhythm: Normal rate and regular rhythm.     Heart sounds: No murmur heard. Pulmonary:     Effort: Pulmonary effort is normal. No respiratory distress.   Musculoskeletal:        General: No swelling.     Cervical back: Neck supple.   Skin:    General: Skin is warm and dry.   Neurological:     Mental Status: He is alert.   Psychiatric:        Mood and Affect: Mood normal.      ED Course / MDM  EKG:EKG Interpretation Date/Time:  Friday May 04 2024 20:17:39 EDT Ventricular Rate:  90 PR Interval:  160 QRS Duration:  99 QT Interval:  379 QTC Calculation: 464 R Axis:   -81  Text Interpretation: Sinus rhythm Left anterior fascicular block Low voltage, precordial leads Consider anterior infarct Confirmed by Donita Furrow ([id number]) on 05/05/2024 12:12:18 AM  I have reviewed the labs performed to date as well as medications administered while in observation.  Recent changes in the last 24 hours include TOC evaluation.  Plan  Current plan is for placement.    Karlyn Overman, MD 05/08/24 [phone]

## 2024-05-09 DIAGNOSIS — R531 Weakness: Secondary | ICD-10-CM | POA: Diagnosis not present

## 2024-05-09 MED ORDER — TRAMADOL HCL 50 MG PO TABS: 50.0000 mg | ORAL_TABLET | Freq: Four times a day (QID) | ORAL | 0 refills | Status: AC | PRN

## 2024-05-09 MED ORDER — CLONAZEPAM 0.5 MG PO TABS: 0.5000 mg | ORAL_TABLET | Freq: Two times a day (BID) | ORAL | 0 refills | Status: AC | PRN

## 2024-05-09 NOTE — Discharge Instructions (Signed)
 You were seen for your weakness in the emergency department.   Check your MyChart online for the results of any tests that had not resulted by the time you left the emergency department.   Follow-up with your primary doctor in 2-3 days regarding your visit.    Return immediately to the emergency department if you experience any of the following: Worsening pain, or any other concerning symptoms.    Thank you for visiting our Emergency Department. It was a pleasure taking care of you today.

## 2024-05-09 NOTE — ED Notes (Addendum)
 Writer was able to get in contact with [name] yesterday about patient. [name] shared that patient became weak and needed to come to the EMS. [name] stated that if patient was able to walk and stand they would allow for him to return. This morning, writer spoke with [name] about [facility] offering patient a bed. [name] asked for writer to give her cell number to facility so they can communicate on when patient will be ready to return.   Margretta Shi with [facility] shared that they can offer patient a bed through his Medicaid- information was sent over to facility as requested. [name] was updated on bed offer. MD and Paramedic was also updated that facility can admit today.    Writer sent over patient AVS and SNF transport report over to [facility]. Room number is 509-A and number to call report is [phone]. TOC signing off.

## 2024-05-09 NOTE — ED Provider Notes (Signed)
 Emergency Medicine Observation Re-evaluation Note  [name] is a 68 y.o. male, seen on rounds today.  Pt initially presented to the ED for complaints of Weakness Currently, the patient is not having new complaints.  Physical Exam  BP 121/74   Pulse 86   Temp 98.3 F (36.8 C) (Oral)   Resp 20   Wt 123.6 kg   SpO2 92%   BMI 42.66 kg/m  Physical Exam General: Resting comfortably in stretcher Lungs: Normal work of breathing Psych: Calm  ED Course / MDM  EKG:EKG Interpretation Date/Time:  Friday May 04 2024 20:17:39 EDT Ventricular Rate:  90 PR Interval:  160 QRS Duration:  99 QT Interval:  379 QTC Calculation: 464 R Axis:   -81  Text Interpretation: Sinus rhythm Left anterior fascicular block Low voltage, precordial leads Consider anterior infarct Confirmed by Donita Furrow ([id number]) on 05/05/2024 12:12:18 AM  I have reviewed the labs performed to date as well as medications administered while in observation.  Recent changes in the last 24 hours include none.  Plan  Current plan is for placement.    Ninetta Basket, MD 05/09/24 [phone]

## 2024-05-09 NOTE — ED Notes (Signed)
 [facility] called to transport patient. Nurse and MD aware

## 2024-05-30 NOTE — Progress Notes (Signed)
 SABRA

## 2024-07-18 DIAGNOSIS — K219 Gastro-esophageal reflux disease without esophagitis: Secondary | ICD-10-CM | POA: Insufficient documentation

## 2024-07-18 DIAGNOSIS — E7800 Pure hypercholesterolemia, unspecified: Secondary | ICD-10-CM | POA: Insufficient documentation

## 2024-07-19 ENCOUNTER — Ambulatory Visit: Admitting: Orthopedic Surgery

## 2025-01-09 ENCOUNTER — Ambulatory Visit: Admitting: Gastroenterology
